# Patient Record
Sex: Female | Born: 1980
Health system: Southern US, Community
[De-identification: ages and names within clinical notes are randomized; demographics above are authoritative.]

## PROBLEM LIST (undated history)

## (undated) DIAGNOSIS — I10 Essential (primary) hypertension: Secondary | ICD-10-CM

## (undated) DIAGNOSIS — N6009 Solitary cyst of unspecified breast: Secondary | ICD-10-CM

## (undated) DIAGNOSIS — R87629 Unspecified abnormal cytological findings in specimens from vagina: Secondary | ICD-10-CM

## (undated) DIAGNOSIS — O24419 Gestational diabetes mellitus in pregnancy, unspecified control: Secondary | ICD-10-CM

## (undated) DIAGNOSIS — D069 Carcinoma in situ of cervix, unspecified: Secondary | ICD-10-CM

## (undated) DIAGNOSIS — Z973 Presence of spectacles and contact lenses: Secondary | ICD-10-CM

## (undated) DIAGNOSIS — E669 Obesity, unspecified: Secondary | ICD-10-CM

## (undated) DIAGNOSIS — E8881 Metabolic syndrome: Secondary | ICD-10-CM

## (undated) HISTORY — DX: Unspecified abnormal cytological findings in specimens from vagina: R87.629

## (undated) HISTORY — PX: OTHER SURGICAL HISTORY: SHX169

## (undated) HISTORY — DX: Solitary cyst of unspecified breast: N60.09

## (undated) HISTORY — DX: Metabolic syndrome: E88.81

## (undated) HISTORY — PX: UPPER GI ENDOSCOPY: SHX6162

## (undated) HISTORY — DX: Carcinoma in situ of cervix, unspecified: D06.9

## (undated) HISTORY — DX: Essential (primary) hypertension: I10

## (undated) HISTORY — DX: Obesity, unspecified: E66.9

## (undated) HISTORY — PX: TONSILLECTOMY: SUR1361

## (undated) HISTORY — DX: Gestational diabetes mellitus in pregnancy, unspecified control: O24.419

---

## 2010-04-15 DIAGNOSIS — D069 Carcinoma in situ of cervix, unspecified: Secondary | ICD-10-CM

## 2010-04-15 HISTORY — DX: Carcinoma in situ of cervix, unspecified: D06.9

## 2010-06-14 HISTORY — PX: CERVICAL CONE BIOPSY: SUR198

## 2014-04-25 LAB — HM PAP SMEAR

## 2014-09-07 LAB — LIPID PANEL
Cholesterol: 223 mg/dL — AB (ref 0–200)
HDL: 37 mg/dL (ref 35–70)
LDL Cholesterol: 142 mg/dL
Triglycerides: 218 mg/dL — AB (ref 40–160)

## 2014-09-07 LAB — BASIC METABOLIC PANEL
BUN: 6 mg/dL (ref 4–21)
Creatinine: 0.7 mg/dL (ref 0.5–1.1)
Glucose: 85 mg/dL
Sodium: 138 mmol/L (ref 137–147)

## 2014-09-07 LAB — HEPATIC FUNCTION PANEL
ALT: 22 U/L (ref 7–35)
AST: 19 U/L (ref 13–35)
Alkaline Phosphatase: 69 U/L (ref 25–125)

## 2014-09-07 LAB — CBC AND DIFFERENTIAL: Platelets: 468 10*3/uL — AB (ref 150–399)

## 2015-03-13 LAB — BASIC METABOLIC PANEL
BUN: 10 mg/dL (ref 4–21)
Creatinine: 0.7 mg/dL (ref 0.5–1.1)
Glucose: 93 mg/dL
Potassium: 4.7 mmol/L (ref 3.4–5.3)
Sodium: 136 mmol/L — AB (ref 137–147)

## 2015-03-13 LAB — LIPID PANEL
Cholesterol: 222 mg/dL — AB (ref 0–200)
HDL: 44 mg/dL (ref 35–70)
LDL Cholesterol: 157 mg/dL
Triglycerides: 106 mg/dL (ref 40–160)

## 2015-03-13 LAB — HEPATIC FUNCTION PANEL
ALT: 21 U/L (ref 7–35)
AST: 16 U/L (ref 13–35)
Alkaline Phosphatase: 73 U/L (ref 25–125)

## 2015-03-13 LAB — HEMOGLOBIN A1C: Hemoglobin A1C: 6

## 2015-03-13 LAB — CBC AND DIFFERENTIAL
Hemoglobin: 15.4 g/dL (ref 12.0–16.0)
Platelets: 479 10*3/uL — AB (ref 150–399)

## 2015-03-14 LAB — CBC AND DIFFERENTIAL
HCT: 46 % (ref 36–46)
Platelets: 479 10*3/uL — AB (ref 150–399)
WBC: 10.4 10^3/mL

## 2015-03-14 LAB — VITAMIN D 25 HYDROXY (VIT D DEFICIENCY, FRACTURES): Vit D, 25-Hydroxy: 35.4

## 2015-11-16 LAB — HM PAP SMEAR: HM Pap smear: NEGATIVE

## 2015-11-22 ENCOUNTER — Encounter: Payer: Self-pay | Admitting: Physician Assistant

## 2015-11-22 ENCOUNTER — Ambulatory Visit (INDEPENDENT_AMBULATORY_CARE_PROVIDER_SITE_OTHER): Payer: Managed Care, Other (non HMO) | Admitting: Physician Assistant

## 2015-11-22 VITALS — BP 128/72 | HR 91 | Ht 63.0 in | Wt 194.0 lb

## 2015-11-22 DIAGNOSIS — D069 Carcinoma in situ of cervix, unspecified: Secondary | ICD-10-CM

## 2015-11-22 DIAGNOSIS — E669 Obesity, unspecified: Secondary | ICD-10-CM | POA: Diagnosis not present

## 2015-11-22 DIAGNOSIS — E8881 Metabolic syndrome: Secondary | ICD-10-CM

## 2015-11-22 DIAGNOSIS — I10 Essential (primary) hypertension: Secondary | ICD-10-CM | POA: Diagnosis not present

## 2015-11-22 DIAGNOSIS — N6009 Solitary cyst of unspecified breast: Secondary | ICD-10-CM | POA: Insufficient documentation

## 2015-11-22 DIAGNOSIS — E88819 Insulin resistance, unspecified: Secondary | ICD-10-CM

## 2015-11-22 HISTORY — DX: Solitary cyst of unspecified breast: N60.09

## 2015-11-22 HISTORY — DX: Essential (primary) hypertension: I10

## 2015-11-22 HISTORY — DX: Obesity, unspecified: E66.9

## 2015-11-22 HISTORY — DX: Metabolic syndrome: E88.81

## 2015-11-22 HISTORY — DX: Carcinoma in situ of cervix, unspecified: D06.9

## 2015-11-22 HISTORY — DX: Insulin resistance, unspecified: E88.819

## 2015-11-22 MED ORDER — LISINOPRIL-HYDROCHLOROTHIAZIDE 20-25 MG PO TABS: 1.0000 | ORAL_TABLET | Freq: Every day | ORAL | 11 refills | Status: DC

## 2015-11-22 NOTE — Patient Instructions (Signed)
Insulin Resistance  Insulin is a hormone made by the pancreas that controls blood sugar (glucose) levels. It is needed for your body to function normally. Insulin resistance occurs when your body does not respond well to the insulin made by your pancreas. Insulin resistance results in high blood glucose levels and can lead to problems, including:  · Prediabetes.  · Type 2 diabetes.  · Heart disease.  · High blood pressure (hypertension).  · Stroke.  · Polycystic ovary syndrome.  · Fatty liver.  CAUSES   The exact causes is unknown.  RISK FACTORS  · Being overweight or obese, especially if your weight is centered in the waist area.  · Physical inactivity.  SIGNS AND SYMPTOMS   There are usually no symptoms.  DIAGNOSIS   There is no test to diagnose insulin resistance. However, your health care provider may suspect that you have insulin resistance if you have:  · High blood glucose levels.  · Abnormal cholesterol levels.  · A waist circumference of more than 35 inches for women and more than 40 inches for men.  · The risk factors for insulin resistance.  To make a diagnosis, your health care provider will perform a physical exam. During the exam, he or she will ask you questions about your health and medical history.  TREATMENT   The most important treatment is lifestyle changes. Your health care provider can help you make the changes that are appropriate. These may include:  · Eating less.  · Being physically active.  · Stopping the use of any tobacco products.  · Taking medicine to help improve your insulin sensitivity.  HOME CARE INSTRUCTIONS   · Eat a healthy diet.    Choose healthy portion sizes.    Eat more vegetables.    Drink more water.    Cut back on high-fat toppings.    Eat foods that are low in fat, such as fruits, vegetables, and lean meats.  · Be physically active to reach and maintain a healthy weight.    Exercise 4 days a week for 20-40 minutes at a time, or as directed by your health care provider.     Take the stairs instead of the elevator.    Walk around when you talk on the phone.    Walk your dog if you have one.  · Take medicines only as directed by your health care provider.  · Do not use any tobacco products, including cigarettes, chewing tobacco, or electronic cigarettes. If you need help quitting, ask your health care provider.  · Keep all follow-up visits as directed by your health care provider. This is important.  SEEK MEDICAL CARE IF:   · You have trouble losing weight or maintaining your goal weight.  · You gain weight.  · You have trouble following your prescribed meal plan.  · You cannot exercise or do your normal exercises.     This information is not intended to replace advice given to you by your health care provider. Make sure you discuss any questions you have with your health care provider.     Document Released: 05/21/2005 Document Revised: 04/22/2014 Document Reviewed: 09/10/2012  Elsevier Interactive Patient Education ©2016 Elsevier Inc.

## 2015-11-22 NOTE — Progress Notes (Signed)
   Subjective:    Patient ID: Rachael Dickerson, female    DOB: 10/01/1980, 35 y.o.   MRN: [id number]  HPI Pt is a 35 yo female who presents to the clinic to establish care.   .. Active Ambulatory Problems    Diagnosis Date Noted  . Adenocarcinoma of cervix, stage 1 (HCC) 11/22/2015  . Benign cyst of breast 11/22/2015  . Essential hypertension, benign 11/22/2015  . Insulin resistance 11/22/2015   Resolved Ambulatory Problems    Diagnosis Date Noted  . No Resolved Ambulatory Problems   No Additional Past Medical History   .Marland Kitchen. Family History  Problem Relation Age of Onset  . Hypertension Mother   . Cancer Maternal Grandmother     melanoma   .Marland Kitchen. Social History   Social History  . Marital status: N/A    Spouse name: N/A  . Number of children: N/A  . Years of education: N/A   Occupational History  . Not on file.   Social History Main Topics  . Smoking status: Former Games developermoker  . Smokeless tobacco: Never Used  . Alcohol use Yes  . Drug use: No  . Sexual activity: Yes   Other Topics Concern  . Not on file   Social History Narrative  . No narrative on file   Pt has HTN on zestoretic. Doing well with no concerns or complaints. No CP, palpitations, headaches and dizziness.    Review of Systems  All other systems reviewed and are negative.      Objective:   Physical Exam  Constitutional: She is oriented to person, place, and time. She appears well-developed and well-nourished.  HENT:  Head: Normocephalic and atraumatic.  Cardiovascular: Normal rate, regular rhythm and normal heart sounds.   Pulmonary/Chest: Effort normal and breath sounds normal.  Neurological: She is alert and oriented to person, place, and time.  Psychiatric: She has a normal mood and affect. Her behavior is normal.          Assessment & Plan:  HTN- controlled on zestoretic. Refilled for one year.  Per pt had labs drawn in [place] before she left. Will send for records.   Insulin  resistance- gave HO. Discussed metformin. Pt will continue to think on medication. Metformin could help with weight loss. Discussed diet to help with insulin resistance.   Obesity- BMI 34. Discussed diet and exercise. Consider metformin.   Adenocarcinoma of cervix, remission- followed by oncology. Last pap normal cells in 11/2015. Dr. Clifton JamesSkinner, [facility] [place].   Pt left before Tdap was given. Will get at next visit.

## 2015-12-08 ENCOUNTER — Encounter: Payer: Self-pay | Admitting: *Deleted

## 2015-12-08 LAB — VITAMIN B12: VITAMIN B12: 193

## 2015-12-08 LAB — VITAMIN D 25 HYDROXY (VIT D DEFICIENCY, FRACTURES)
Vit D, 25-Hydroxy: 35.4
Vit D, 25-Hydroxy: 34.3

## 2015-12-12 ENCOUNTER — Encounter: Payer: Self-pay | Admitting: Physician Assistant

## 2015-12-15 ENCOUNTER — Encounter: Payer: Self-pay | Admitting: Physician Assistant

## 2015-12-27 LAB — HEPATIC FUNCTION PANEL
ALT: 23 U/L (ref 7–35)
AST: 13 U/L (ref 13–35)
Alkaline Phosphatase: 58 U/L (ref 25–125)
Bilirubin, Total: 0.3 mg/dL

## 2015-12-27 LAB — BASIC METABOLIC PANEL
BUN: 9 mg/dL (ref 4–21)
Creatinine: 0.6 mg/dL (ref 0.5–1.1)
Glucose: 92 mg/dL
Potassium: 4.4 mmol/L (ref 3.4–5.3)
Sodium: 137 mmol/L (ref 137–147)

## 2015-12-27 LAB — CBC AND DIFFERENTIAL
HCT: 43 % (ref 36–46)
Hemoglobin: 14.6 g/dL (ref 12.0–16.0)
Neutrophils Absolute: 5 /uL
Platelets: 462 10*3/uL — AB (ref 150–399)
WBC: 8.4 10^3/mL

## 2015-12-27 LAB — LIPID PANEL
Cholesterol: 183 mg/dL (ref 0–200)
HDL: 36 mg/dL (ref 35–70)
LDL Cholesterol: 110 mg/dL
Triglycerides: 185 mg/dL — AB (ref 40–160)

## 2016-01-04 ENCOUNTER — Encounter: Payer: Self-pay | Admitting: Physician Assistant

## 2016-03-12 ENCOUNTER — Encounter: Payer: Self-pay | Admitting: Physician Assistant

## 2016-03-12 ENCOUNTER — Ambulatory Visit (INDEPENDENT_AMBULATORY_CARE_PROVIDER_SITE_OTHER): Payer: BLUE CROSS/BLUE SHIELD | Admitting: Physician Assistant

## 2016-03-12 VITALS — BP 144/78 | HR 94 | Ht 63.0 in | Wt 201.0 lb

## 2016-03-12 DIAGNOSIS — D069 Carcinoma in situ of cervix, unspecified: Secondary | ICD-10-CM | POA: Diagnosis not present

## 2016-03-12 DIAGNOSIS — R238 Other skin changes: Secondary | ICD-10-CM

## 2016-03-12 DIAGNOSIS — T753XXA Motion sickness, initial encounter: Secondary | ICD-10-CM

## 2016-03-12 MED ORDER — METRONIDAZOLE 1 % EX GEL: Freq: Every day | CUTANEOUS | 0 refills | Status: DC

## 2016-03-12 MED ORDER — SCOPOLAMINE 1 MG/3DAYS TD PT72: 1.0000 | MEDICATED_PATCH | TRANSDERMAL | 1 refills | Status: DC

## 2016-03-12 NOTE — Progress Notes (Signed)
   Subjective:    Patient ID: Rachael LoaJennifer Dickerson, female    DOB: [birth year], 35 y.o.   MRN: [id number]  HPI  Pt is a 35 yo female who presents to the clinic for a few concerns today.   She has a papule that has formed on left upper cheek in the last week. It does not hurt but itches some. She has not tried anything to make better.   She is going on a cruise and needs anti-nausea medication.   She has hx of adenocarcinoma of cervix she needs another referral to oncology/gyn for follow up. She went to office in [place]    Review of Systems  All other systems reviewed and are negative.      Objective:   Physical Exam  Constitutional: She is oriented to person, place, and time. She appears well-developed and well-nourished.  HENT:  Head: Normocephalic and atraumatic.  Cardiovascular: Normal rate, regular rhythm and normal heart sounds.   Pulmonary/Chest: Effort normal and breath sounds normal.  Neurological: She is alert and oriented to person, place, and time.  Skin:     Psychiatric: She has a normal mood and affect. Her behavior is normal.          Assessment & Plan:  Marland Kitchen.Marland Kitchen.Diagnoses and all orders for this visit:  Papule of skin -     metroNIDAZOLE (METROGEL) 1 % gel; Apply topically daily. -     Ambulatory referral to Dermatology  Motion sickness, initial encounter -     scopolamine (TRANSDERM-SCOP) 1 MG/3DAYS; Place 1 patch (1.5 mg total) onto the skin every 3 (three) days.  Adenocarcinoma in situ of cervix -     Ambulatory referral to Gynecologic Oncology  Other orders -     Discontinue: metroNIDAZOLE (METROGEL) 1 % gel; Apply topically daily.   Unclear etiology of skin papule. Reassured patient it did not appear to be infectious of skin cancer. Suspicious for non pigmented nevus but popping up so quickly is not common. Also wondering if could be from rosacea. metrogel given and discussed if itchy could combine with hydrocortisone. Referral made. Either way patient wants  removed if it can be.

## 2016-03-13 ENCOUNTER — Encounter: Payer: Self-pay | Admitting: Physician Assistant

## 2016-04-04 ENCOUNTER — Telehealth: Payer: Self-pay | Admitting: Hematology & Oncology

## 2016-04-04 NOTE — Telephone Encounter (Signed)
Called patient regarding New Patient referral received from Tandy [facility]Jade Breeback at [facility]. Patient stated that she does not want to see Dr. Myna HidalgoEnnever if she will be billed under the Hospital status, as opposed to Physician's Office. I advised patient that she would be billed under both our Hospital and Physician Billing categories if seen in our office. Patient stated that she wants to call [facility] Billing and inquire before scheduling a New Patient appt with Dr. Myna HidalgoEnnever. I provided the patient the Billing Dept number of [phone]. Patient stated she would call them first.       [facility].

## 2016-04-09 ENCOUNTER — Encounter: Payer: Self-pay | Admitting: Physician Assistant

## 2016-05-15 ENCOUNTER — Telehealth: Payer: Self-pay | Admitting: Hematology & Oncology

## 2016-05-15 NOTE — Telephone Encounter (Signed)
Patient called and stated that she does not want a New Patient Appt with Dr. Myna HidalgoEnnever because her insurance will charge her an additional $1,000.00 due to the hospital facility charge. Patient stated that she will call Dallas SchimkeJade Breeback's office to inquire if they can give a referral to an hematolgist/oncologist not associated with a larger health system.       [facility] JANUARY 2018  [facility]

## 2016-07-17 ENCOUNTER — Encounter: Payer: Self-pay | Admitting: Physician Assistant

## 2016-07-17 ENCOUNTER — Ambulatory Visit (INDEPENDENT_AMBULATORY_CARE_PROVIDER_SITE_OTHER): Payer: BLUE CROSS/BLUE SHIELD | Admitting: Physician Assistant

## 2016-07-17 VITALS — BP 155/87 | HR 102 | Ht 63.0 in | Wt 205.0 lb

## 2016-07-17 DIAGNOSIS — I10 Essential (primary) hypertension: Secondary | ICD-10-CM

## 2016-07-17 DIAGNOSIS — Z319 Encounter for procreative management, unspecified: Secondary | ICD-10-CM | POA: Diagnosis not present

## 2016-07-17 DIAGNOSIS — D069 Carcinoma in situ of cervix, unspecified: Secondary | ICD-10-CM

## 2016-07-17 MED ORDER — METHYLDOPA 250 MG PO TABS: 250.0000 mg | ORAL_TABLET | Freq: Two times a day (BID) | ORAL | 0 refills | Status: DC

## 2016-07-17 NOTE — Patient Instructions (Signed)
Methyldopa tablets What is this medicine? METHYLDOPA (meth ill DOE pa) is used to treat high blood pressure. This medicine may be used for other purposes; ask your health care provider or pharmacist if you have questions. COMMON BRAND NAME(S): Aldomet What should I tell my health care provider before I take this medicine? They need to know if you have any of these conditions: -anemia -kidney or liver disease -an unusual or allergic reaction to methyldopa, other medicines, foods, dyes, or preservatives -pregnant or trying to get pregnant -breast-feeding How should I use this medicine? Take this medicine by mouth with a glass of water. Follow the directions on the prescription label. Take your doses at regular intervals. Do not take your medicine more often than directed. Do not stop taking except on the advice of your doctor or health care professional. Talk to your pediatrician regarding the use of this medicine in children. Special care may be needed. Overdosage: If you think you have taken too much of this medicine contact a poison control center or emergency room at once. NOTE: This medicine is only for you. Do not share this medicine with others. What if I miss a dose? If you miss a dose, take it as soon as you can. If it is almost time for your next dose, take only that dose. Do not take double or extra doses. What may interact with this medicine? Do not take this medicine with any of the following medications: -MAOIs like Carbex, Eldepryl, Marplan, Nardil, and Parnate This medicine also may interact with the following medications: -iron salts -lithium -medicines for high blood pressure This list may not describe all possible interactions. Give your health care provider a list of all the medicines, herbs, non-prescription drugs, or dietary supplements you use. Also tell them if you smoke, drink alcohol, or use illegal drugs. Some items may interact with your medicine. What should I  watch for while using this medicine? Visit your doctor or health care professional for regular checks on your progress. Check your heart rate and blood pressure regularly. Ask your doctor or health care professional what your heart rate should be and when you should contact him or her. If you get a fever, especially in the first few months, call your doctor or health care professional. Do not treat yourself. You may get drowsy or dizzy. Do not drive, use machinery, or do anything that needs mental alertness until you know how this medicine affects you. To avoid dizzy or fainting spells, do not stand or sit up quickly, especially if you are an older person. Alcohol can make you more drowsy and dizzy. Avoid alcoholic drinks. Your mouth may get dry. Chewing sugarless gum or sucking hard candy, and drinking plenty of water may help. Contact your doctor if the problem does not go away or is severe. Iron can stop the absorption of this medicine. Do not take this medicine with iron preparations or multiple vitamins containing iron. If you have to take iron, make sure that there are at least 2 hours between iron and methyldopa doses. What side effects may I notice from receiving this medicine? Side effects that you should report to your doctor or health care professional as soon as possible: -allergic reactions like skin rash, itching or hives, swelling of the face, lips, or tongue -black, sore tongue -chest pain -dark yellow or brown urine -depression -difficulty sleeping, nightmares -fever (usually within the first 3 months of treatment) -slow heartbeat -stomach pain -swelling of the feet or  legs -unusually weak or tired -yellowing of the eyes or skin Side effects that usually do not require medical attention (report to your doctor or health care professional if they continue or are bothersome): -abnormal production of milk in females -breast enlargement in both males and females -change in sex drive  or performance -diarrhea -headache -nausea, vomiting -numbness or tingling in hands or feet This list may not describe all possible side effects. Call your doctor for medical advice about side effects. You may report side effects to FDA at 1-800-FDA-1088. Where should I keep my medicine? Keep out of the reach of children. Store at room temperature between 15 and 30 degrees C (59 and 86 degrees F). Keep container tightly closed. Throw away any unused medicine after the expiration date. NOTE: This sheet is a summary. It may not cover all possible information. If you have questions about this medicine, talk to your doctor, pharmacist, or health care provider.  2018 Elsevier/Gold Standard (2007-10-19 14:04:24)

## 2016-07-17 NOTE — Progress Notes (Signed)
   Subjective:    Patient ID: Rachael Dickerson, female    DOB: 01-Jul-1980, 36 y.o.   MRN: [id number]  HPI  Pt is a 36 yo female who presents to the clinic for follow up on HTN. She is taking medication but she desires to get pregnant. She has been checking BP at home and reports better readings under 140/90. She denies any CP, palpitations, headaches, SOB. This will be her first pregnancy. She does have hx of adenocarcinoma of cervix in remission. She remains on OCP.    Review of Systems  All other systems reviewed and are negative.      Objective:   Physical Exam  Constitutional: She is oriented to person, place, and time. She appears well-developed and well-nourished.  HENT:  Head: Normocephalic and atraumatic.  Cardiovascular: Normal rate, regular rhythm and normal heart sounds.   Pulmonary/Chest: Effort normal and breath sounds normal. She has no wheezes.  Neurological: She is alert and oriented to person, place, and time.  Psychiatric: She has a normal mood and affect. Her behavior is normal.          Assessment & Plan:  Marland KitchenMarland KitchenDiagnoses and all orders for this visit:  Patient desires pregnancy  Essential hypertension, benign -     methyldopa (ALDOMET) 250 MG tablet; Take 1 tablet (250 mg total) by mouth 2 (two) times daily.  Adenocarcinoma in situ of cervix   Pt instructed to StOP lisinopril.  Start methyldopa bid.  2 week nurse visit.  Once BP controlled stop OCP.  Wait 1-2 cycles and can start trying to get pregnant.  Start prenatal vitamin now.

## 2016-07-31 ENCOUNTER — Ambulatory Visit (INDEPENDENT_AMBULATORY_CARE_PROVIDER_SITE_OTHER): Payer: BLUE CROSS/BLUE SHIELD | Admitting: Family Medicine

## 2016-07-31 VITALS — BP 134/80 | HR 78 | Resp 16 | Wt 202.2 lb

## 2016-07-31 DIAGNOSIS — I10 Essential (primary) hypertension: Secondary | ICD-10-CM

## 2016-07-31 NOTE — Progress Notes (Signed)
Pt presents to clinic for BP check after recent change in medications. No complaints of dizziness, headaches, chest pain/discomfort. Pt also states at home readings have been around 120/70.

## 2016-08-16 ENCOUNTER — Other Ambulatory Visit: Payer: Self-pay | Admitting: Physician Assistant

## 2016-08-16 DIAGNOSIS — I10 Essential (primary) hypertension: Secondary | ICD-10-CM

## 2016-09-19 ENCOUNTER — Other Ambulatory Visit: Payer: Self-pay | Admitting: Physician Assistant

## 2016-09-19 DIAGNOSIS — I10 Essential (primary) hypertension: Secondary | ICD-10-CM

## 2016-10-04 ENCOUNTER — Ambulatory Visit (INDEPENDENT_AMBULATORY_CARE_PROVIDER_SITE_OTHER): Payer: BLUE CROSS/BLUE SHIELD | Admitting: Physician Assistant

## 2016-10-04 VITALS — BP 134/88 | HR 77 | Temp 97.7°F | Wt 193.0 lb

## 2016-10-04 DIAGNOSIS — N3000 Acute cystitis without hematuria: Secondary | ICD-10-CM

## 2016-10-04 LAB — POCT URINALYSIS DIPSTICK
Bilirubin, UA: NEGATIVE
Glucose, UA: NEGATIVE
Ketones, UA: NEGATIVE
Nitrite, UA: NEGATIVE
Protein, UA: NEGATIVE
Spec Grav, UA: <=1.005 — AB (ref 1.010–1.025)
Urobilinogen, UA: 0.2 E.U./dL
pH, UA: 7 (ref 5.0–8.0)

## 2016-10-04 LAB — POCT URINE PREGNANCY: Preg Test, Ur: NEGATIVE

## 2016-10-04 MED ORDER — NITROFURANTOIN MONOHYD MACRO 100 MG PO CAPS: 100.0000 mg | ORAL_CAPSULE | Freq: Two times a day (BID) | ORAL | 0 refills | Status: DC

## 2016-10-04 NOTE — Progress Notes (Signed)
HPI:                                                                Rachael LoaJennifer Dickerson is a 36 y.o. female who [facility] today for dysuria  Dysuria   This is a new problem. The current episode started today. The problem occurs every urination. The problem has been unchanged. The quality of the pain is described as burning. The pain is mild. There has been no fever. She is sexually active. There is no history of pyelonephritis. Associated symptoms include frequency. Pertinent negatives include no chills, discharge, flank pain, nausea, possible pregnancy, sweats or vomiting.    Past Medical History:  Diagnosis Date  . Adenocarcinoma in situ of cervix 11/22/2015   2012. Last pap was 11/16/2015 and cells were normal.   . Benign cyst of breast 11/22/2015  . Essential hypertension, benign 11/22/2015  . Insulin resistance 11/22/2015  . Obesity 11/22/2015   Past Surgical History:  Procedure Laterality Date  . TONSILLECTOMY     Social History  Substance Use Topics  . Smoking status: Former Games developermoker  . Smokeless tobacco: Never Used  . Alcohol use Yes   family history includes Cancer in her maternal grandmother; Hypertension in her mother.  ROS: negative except as noted in the HPI  Medications: Current Outpatient Prescriptions  Medication Sig Dispense Refill  . methyldopa (ALDOMET) 250 MG tablet TAKE 1 TABLET BY MOUTH TWICE DAILY 60 tablet 0  . nitrofurantoin, macrocrystal-monohydrate, (MACROBID) 100 MG capsule Take 1 capsule (100 mg total) by mouth 2 (two) times daily. 14 capsule 0   No current facility-administered medications for this visit.    Allergies  Allergen Reactions  . Erythromycin   . Latex        Objective:  BP 134/88   Pulse 77   Temp 97.7 F (36.5 C) (Oral)   Wt 193 lb (87.5 kg)   LMP 09/26/2016 (Exact Date)   BMI 34.19 kg/m  Gen: well-groomed, cooperative, not ill-appearing, no distress Pulm: Normal work of  breathing, normal phonation, clear to auscultation bilaterally CV: Normal rate, regular rhythm, s1 and s2 distinct, no murmurs, clicks or rubs  GI: abdomen soft, nondistended, nontender, no CVA tenderness Neuro: alert and oriented x 3, EOM's intact, no tremor MSK: moving all extremities, normal gait and station, no peripheral edema Skin: warm, dry, intact; no rashes or lesions on exposed skin, no cyanosis   Results for orders placed or performed in visit on 10/04/16 (from the past 72 hour(s))  POCT Urinalysis Dipstick     Status: Abnormal   Collection Time: 10/04/16  1:53 PM  Result Value Ref Range   Color, UA light yellow    Clarity, UA clear    Glucose, UA negative    Bilirubin, UA negative    Ketones, UA negative    Spec Grav, UA <=1.005 (A) 1.010 - 1.025   Blood, UA trace    pH, UA 7.0 5.0 - 8.0   Protein, UA negative    Urobilinogen, UA 0.2 0.2 or 1.0 E.U./dL   Nitrite, UA negative    Leukocytes, UA Small (1+) (A) Negative  POCT urine pregnancy     Status: Normal   Collection Time: 10/04/16  1:57  PM  Result Value Ref Range   Preg Test, Ur Negative Negative   No results found.    Assessment and Plan: 36 y.o. female with   1. Acute cystitis without hematuria - POCT Urinalysis Dipstick positive for trace blood, small leuks - POCT urine pregnancy negative - Urine Culture; Future - nitrofurantoin, macrocrystal-monohydrate, (MACROBID) 100 MG capsule; Take 1 capsule (100 mg total) by mouth 2 (two) times daily.  Dispense: 14 capsule; Refill: 0  Patient education and anticipatory guidance given Patient agrees with treatment plan Follow-up as needed if symptoms worsen or fail to improve  Levonne Hubert PA-C

## 2016-10-04 NOTE — Patient Instructions (Signed)
- Take Macrobid twice a day for 7 days - Azo over-the-counter as needed for painful urination - Void after sexual intercourse to help prevent future UTI's   Urinary Tract Infection, Adult A urinary tract infection (UTI) is an infection of any part of the urinary tract, which includes the kidneys, ureters, bladder, and urethra. These organs make, store, and get rid of urine in the body. UTI can be a bladder infection (cystitis) or kidney infection (pyelonephritis). What are the causes? This infection may be caused by fungi, viruses, or bacteria. Bacteria are the most common cause of UTIs. This condition can also be caused by repeated incomplete emptying of the bladder during urination. What increases the risk? This condition is more likely to develop if:  You ignore your need to urinate or hold urine for long periods of time.  You do not empty your bladder completely during urination.  You wipe back to front after urinating or having a bowel movement, if you are female.  You are uncircumcised, if you are female.  You are constipated.  You have a urinary catheter that stays in place (indwelling).  You have a weak defense (immune) system.  You have a medical condition that affects your bowels, kidneys, or bladder.  You have diabetes.  You take antibiotic medicines frequently or for long periods of time, and the antibiotics no longer work well against certain types of infections (antibiotic resistance).  You take medicines that irritate your urinary tract.  You are exposed to chemicals that irritate your urinary tract.  You are female.  What are the signs or symptoms? Symptoms of this condition include:  Fever.  Frequent urination or passing small amounts of urine frequently.  Needing to urinate urgently.  Pain or burning with urination.  Urine that smells bad or unusual.  Cloudy urine.  Pain in the lower abdomen or back.  Trouble urinating.  Blood in the  urine.  Vomiting or being less hungry than normal.  Diarrhea or abdominal pain.  Vaginal discharge, if you are female.  How is this diagnosed? This condition is diagnosed with a medical history and physical exam. You will also need to provide a urine sample to test your urine. Other tests may be done, including:  Blood tests.  Sexually transmitted disease (STD) testing.  If you have had more than one UTI, a cystoscopy or imaging studies may be done to determine the cause of the infections. How is this treated? Treatment for this condition often includes a combination of two or more of the following:  Antibiotic medicine.  Other medicines to treat less common causes of UTI.  Over-the-counter medicines to treat pain.  Drinking enough water to stay hydrated.  Follow these instructions at home:  Take over-the-counter and prescription medicines only as told by your health care provider.  If you were prescribed an antibiotic, take it as told by your health care provider. Do not stop taking the antibiotic even if you start to feel better.  Avoid alcohol, caffeine, tea, and carbonated beverages. They can irritate your bladder.  Drink enough fluid to keep your urine clear or pale yellow.  Keep all follow-up visits as told by your health care provider. This is important.  Make sure to: ? Empty your bladder often and completely. Do not hold urine for long periods of time. ? Empty your bladder before and after sex. ? Wipe from front to back after a bowel movement if you are female. Use each tissue one time when  you wipe. Contact a health care provider if:  You have back pain.  You have a fever.  You feel nauseous or vomit.  Your symptoms do not get better after 3 days.  Your symptoms go away and then return. Get help right away if:  You have severe back pain or lower abdominal pain.  You are vomiting and cannot keep down any medicines or water. This information is not  intended to replace advice given to you by your health care provider. Make sure you discuss any questions you have with your health care provider. Document Released: 01/09/2005 Document Revised: 09/13/2015 Document Reviewed: 02/20/2015 Elsevier Interactive Patient Education  2017 [employer]

## 2016-10-06 ENCOUNTER — Encounter: Payer: Self-pay | Admitting: Physician Assistant

## 2016-10-10 LAB — URINE CULTURE

## 2016-10-21 ENCOUNTER — Telehealth: Payer: Self-pay | Admitting: *Deleted

## 2016-10-21 DIAGNOSIS — N3000 Acute cystitis without hematuria: Secondary | ICD-10-CM

## 2016-10-21 MED ORDER — CEFUROXIME AXETIL 250 MG PO TABS: 250.0000 mg | ORAL_TABLET | Freq: Two times a day (BID) | ORAL | 0 refills | Status: AC

## 2016-10-21 NOTE — Telephone Encounter (Signed)
Please let patient know we don't typically call with urine culture results unless there is a need to change or discontinue the antibiotic. In her case, her urine culture was positive and the antibiotic prescribed should cover it.  She can come in for a nurse visit for a urinalysis and urine culture. I will send a new antibiotic to her pharmacy once she provides her urine sample. If antibiotic is started before the urine is cultured it can lead to a false negative.

## 2016-10-21 NOTE — Telephone Encounter (Signed)
That is fine. Prescription sent for a new antibiotic.

## 2016-10-21 NOTE — Telephone Encounter (Signed)
Patient notified. The patient has a high deductible and already she says she owes about $145. She states that it is getting very expensive to come in. She states her symptoms never completely went away even after having started taking the antibiotic.The patient states she took the medication just as it instructed. She states she called the other day and [facility] but this would have been even more expensive for her.I did explain to her the process of rechecking urine since she is still having symtoms and that the bacteria sensitivities were checked on culture and bacteria should have been sensitive to antibiotic prescribed. The patient wants to know if possible  can she have another prescription for an  antibiotic before coming in at this point due to financial hardship. Please advise

## 2016-10-21 NOTE — Telephone Encounter (Signed)
Information discussed with pt. Pt verbalized understanding. 

## 2016-10-21 NOTE — Telephone Encounter (Signed)
Can you please take a look at this patient's urine culture results? She is inquiring about the results and she recently started having symptoms again. Will need another appointment but no one ever called her about her culture results

## 2016-10-22 ENCOUNTER — Other Ambulatory Visit: Payer: Self-pay | Admitting: Physician Assistant

## 2016-10-22 ENCOUNTER — Telehealth: Payer: Self-pay | Admitting: *Deleted

## 2016-10-22 DIAGNOSIS — I10 Essential (primary) hypertension: Secondary | ICD-10-CM

## 2016-10-22 NOTE — Telephone Encounter (Signed)
Jade prescribed methyldopa for patient and this has been placed on back order until December. I called the pharmacy to confirm and the manufacturer has placed this on back order. The pharmacy was able to give the patient only 48 tablets today.

## 2016-10-22 NOTE — Telephone Encounter (Signed)
Noted, can address with PCP when she is back in the office or come sooner to discuss alternatives if needed

## 2016-10-23 NOTE — Telephone Encounter (Signed)
Message left on patients vm 

## 2016-11-06 ENCOUNTER — Ambulatory Visit (INDEPENDENT_AMBULATORY_CARE_PROVIDER_SITE_OTHER): Payer: BLUE CROSS/BLUE SHIELD | Admitting: Physician Assistant

## 2016-11-06 ENCOUNTER — Encounter: Payer: Self-pay | Admitting: Physician Assistant

## 2016-11-06 VITALS — BP 122/81 | HR 96 | Wt 195.0 lb

## 2016-11-06 DIAGNOSIS — I10 Essential (primary) hypertension: Secondary | ICD-10-CM

## 2016-11-06 MED ORDER — LABETALOL HCL 100 MG PO TABS: 100.0000 mg | ORAL_TABLET | Freq: Two times a day (BID) | ORAL | 0 refills | Status: DC

## 2016-11-06 NOTE — Progress Notes (Signed)
HPI:                                                                Rachael Dickerson is a 36 y.o. female who [facility] today for hypertension follow-up  Patient with PMH of HTN who desires pregnancy (G0P0) was recently placed on Methyldopa by her PCP. Reports that this medication is unavailable and is requesting an alternative.   Does not Check BP's at home. Denies vision change, headache, chest pain with exertion, orthopnea, lightheadedness, syncope and edema. Risk factors include: obesity, insulin resistance  Past Medical History:  Diagnosis Date  . Adenocarcinoma in situ of cervix 11/22/2015   2012. Last pap was 11/16/2015 and cells were normal.   . Benign cyst of breast 11/22/2015  . Essential hypertension, benign 11/22/2015  . Insulin resistance 11/22/2015  . Obesity 11/22/2015   Past Surgical History:  Procedure Laterality Date  . TONSILLECTOMY     Social History  Substance Use Topics  . Smoking status: Former Smoker    Packs/day: 1.00    Years: 20.00    Types: Cigarettes  . Smokeless tobacco: Never Used  . Alcohol use Yes   family history includes Cancer in her maternal grandmother; Hypertension in her mother.  ROS: negative except as noted in the HPI  Medications: Current Outpatient Prescriptions  Medication Sig Dispense Refill  . methyldopa (ALDOMET) 250 MG tablet TAKE 1 TABLET BY MOUTH TWICE DAILY 60 tablet 0   No current facility-administered medications for this visit.    Allergies  Allergen Reactions  . Erythromycin   . Latex        Objective:  BP 122/81   Pulse 96   Wt 195 lb (88.5 kg)   SpO2 99%   BMI 34.54 kg/m  Gen: obese, not ill-appearing, no distress HEENT: normal conjunctiva, trachea midline Pulm: Normal work of breathing, normal phonation, clear to auscultation bilaterally CV: Normal rate, regular rhythm, s1 and s2 distinct, no murmurs, clicks or rubs  Neuro: alert and oriented x 3,  EOM's intact, no tremor MSK: extremities atraumatic, normal gait and station Skin: no rashes on exposed skin, no cyanosis Psych: well-groomed, cooperative, good eye contact, normal affect, normal speech and thought content   No results found for this or any previous visit (from the past 72 hour(s)). No results found.   Assessment and Plan: 36 y.o. female with  1. Essential hypertension, benign - labetalol (NORMODYNE) 100 MG tablet; Take 1 tablet (100 mg total) by mouth 2 (two) times daily.  Dispense: 60 tablet; Refill: 0 - patient to monitor BP at home - she does not want to come in for a nurse BP check b/c she has a high-deductible plan - she will drop off her BP log - recommend referral to OB/GYN for pre-conception counseling given patient is high risk   Patient education and anticipatory guidance given Patient agrees with treatment plan Follow-up as needed if symptoms worsen or fail to improve  Levonne Hubertharley E. Eddie Payette PA-C

## 2016-11-06 NOTE — Patient Instructions (Signed)
Let's try Labetalol twice a day Monitor your BP's at home Follow-up in 2 weeks for a nurse visit BP check  If we are not able to control your pressure with Labetalol by itself, I recommend setting up a pre-conception counseling appointment with an OB/GYN or following up with Cardiology

## 2016-12-17 ENCOUNTER — Other Ambulatory Visit: Payer: Self-pay | Admitting: Physician Assistant

## 2016-12-17 DIAGNOSIS — I10 Essential (primary) hypertension: Secondary | ICD-10-CM

## 2016-12-17 NOTE — Telephone Encounter (Signed)
Ok great.

## 2017-01-16 ENCOUNTER — Other Ambulatory Visit: Payer: Self-pay | Admitting: Physician Assistant

## 2017-01-16 DIAGNOSIS — I10 Essential (primary) hypertension: Secondary | ICD-10-CM

## 2017-03-25 ENCOUNTER — Ambulatory Visit (INDEPENDENT_AMBULATORY_CARE_PROVIDER_SITE_OTHER): Payer: BLUE CROSS/BLUE SHIELD | Admitting: Physician Assistant

## 2017-03-25 ENCOUNTER — Encounter: Payer: Self-pay | Admitting: Physician Assistant

## 2017-03-25 VITALS — BP 136/78 | HR 103 | Ht 63.0 in | Wt 203.0 lb

## 2017-03-25 DIAGNOSIS — I10 Essential (primary) hypertension: Secondary | ICD-10-CM

## 2017-03-25 DIAGNOSIS — R5383 Other fatigue: Secondary | ICD-10-CM

## 2017-03-25 DIAGNOSIS — Z13220 Encounter for screening for lipoid disorders: Secondary | ICD-10-CM | POA: Diagnosis not present

## 2017-03-25 DIAGNOSIS — H5711 Ocular pain, right eye: Secondary | ICD-10-CM

## 2017-03-25 DIAGNOSIS — Z77011 Contact with and (suspected) exposure to lead: Secondary | ICD-10-CM | POA: Diagnosis not present

## 2017-03-25 MED ORDER — LABETALOL HCL 100 MG PO TABS: 100.0000 mg | ORAL_TABLET | Freq: Two times a day (BID) | ORAL | 3 refills | Status: DC

## 2017-03-25 NOTE — Progress Notes (Addendum)
Subjective:    Patient ID: Rachael Dickerson, female    DOB: 1980/10/02, 36 y.o.   MRN: [id number]  HPI Patient is a 36 y/o female who presents for right eye burning. She reports that right before Thanksgiving she slept in her contacts and when she woke up she had some pain and burning in her right eye and felt like there was an object in it. She describes the sensation of an object as occurring directly over the cornea when she has her contact lenses in or when she closes her eyes. She states that she removed her contacts for a few days and her symptoms resolved, however they reoccurred after she put her contacts back in. She thought her contact lenses may have been old so she switched to new contact lenses, however she still reports symptoms. She has tried rinsing her eye with saline with minimal releif She reports a history of ulcers in her right eye a few years ago due to a sensitivity to a new brand of contacts. She reports increased redness of her right eye when she has the contacts in and increased watering of her right eye. She denies itching, pain, dryness, or discharge of her right eye, as well as photophobia, blurred vision, or visual disturbances. She denies any symptoms in her left eye. She reports that she has an appointment with an eye doctor for her yearly vision check next week.  HTN - patient reports change in her antihypertensive medication to labetalol in July due to desired pregnancy. She reports taking her medication early in the morning and then late at night prior to going to bed and feels that it is not working as well with this timing. She reports elevation of her blood pressure around dinner time with this schedule.   Health maintenance - Patient reports working in a [employer] and would like her lead levels evaluated today. She also reports lack of energy and would like her labs rechecked today because she has not had her labs drawn in over 1 year.  .. Active Ambulatory  Problems    Diagnosis Date Noted  . Adenocarcinoma in situ of cervix 11/22/2015  . Benign cyst of breast 11/22/2015  . Essential hypertension, benign 11/22/2015  . Insulin resistance 11/22/2015  . Obesity 11/22/2015   Resolved Ambulatory Problems    Diagnosis Date Noted  . No Resolved Ambulatory Problems   Past Medical History:  Diagnosis Date  . Adenocarcinoma in situ of cervix 11/22/2015  . Benign cyst of breast 11/22/2015  . Essential hypertension, benign 11/22/2015  . Insulin resistance 11/22/2015  . Obesity 11/22/2015      Review of Systems  Eyes: Positive for redness (right eye). Negative for photophobia, pain, discharge, itching and visual disturbance.  Neurological: Negative for headaches.      Objective:   Physical Exam  Constitutional: She is oriented to person, place, and time. She appears well-developed and well-nourished.  HENT:  Head: Normocephalic and atraumatic.  Eyes: Conjunctivae, EOM and lids are normal. Pupils are equal, round, and reactive to light. Right eye exhibits no discharge. No foreign body present in the right eye. Left eye exhibits no discharge. No foreign body present in the left eye. No scleral icterus.  Bilateral funduscopic exam normal. Fluoroscien stain of right eye did not reveal scleral or corneal abrasions. Visual acuity with corrective lenses 20/30 on the right and 25/20 on the left. Patient reports stronger prescription on the left.  Cardiovascular: Normal rate, regular rhythm and normal  heart sounds.  Neurological: She is alert and oriented to person, place, and time.  Psychiatric: She has a normal mood and affect. Her behavior is normal.      Assessment & Plan:  .Marland Kitchen.Rachael Dickerson was seen today for right eye pain.  Diagnoses and all orders for this visit:  Eye pain, right  Essential hypertension, benign -     labetalol (NORMODYNE) 100 MG tablet; Take 1 tablet (100 mg total) by mouth 2 (two) times daily. -     COMPLETE METABOLIC PANEL WITH  GFR  Screening for lipid disorders -     Lipid Panel w/reflex Direct LDL  No energy -     CBC -     Vitamin D 1,25 dihydroxy -     B12  Exposure to lead -     Lead, blood   Right eye pain - No evidence of acute infection or corneal abrasion on exam today. Advised patient to follow-up with ophthalmology to further evaluate her sensation of irritation in her right eye. I suspect there could be some irritation from contacts. Discussed with patient that if she begins to experience increased pain, redness, or discharge from her eye to follow-up sooner for re-evaluation.   Hypertension -  Patients BP was elevated at 145/79 mmHg at the beginning of the visit but decreased to 136/78 mmHg at the end of the visit. Advised patient to continue taking her labetolol in the morning, but switch her second dose to before dinner instead of before bed. Discussed with patient that she should check her BP regularly and if it is not better controlled with this change in timing of her medications to follow-up for possible increase.  Health maintenance - Recheck fasting lipid panel and CMP. Recheck CBC, Vitamin D and B12 for history of decreased energy. Patient requested lead level due to lead exposure with her occupation.  Marland Kitchen..Spent 30 minutes with patient and greater than 50 percent of visit spent counseling patient regarding treatment plan.

## 2017-04-08 LAB — COMPLETE METABOLIC PANEL WITH GFR
AG Ratio: 1.7 (calc) (ref 1.0–2.5)
ALT: 23 U/L (ref 6–29)
AST: 17 U/L (ref 10–30)
Albumin: 4.3 g/dL (ref 3.6–5.1)
Alkaline phosphatase (APISO): 74 U/L (ref 33–115)
BUN: 10 mg/dL (ref 7–25)
CO2: 25 mmol/L (ref 20–32)
Calcium: 9.5 mg/dL (ref 8.6–10.2)
Chloride: 103 mmol/L (ref 98–110)
Creat: 0.7 mg/dL (ref 0.50–1.10)
GFR, Est African American: 128 mL/min/{1.73_m2} (ref >=60)
GFR, Est Non African American: 111 mL/min/{1.73_m2} (ref >=60)
Globulin: 2.6 g/dL (calc) (ref 1.9–3.7)
Glucose, Bld: 99 mg/dL (ref 65–99)
Potassium: 4.4 mmol/L (ref 3.5–5.3)
Sodium: 139 mmol/L (ref 135–146)
Total Bilirubin: 0.5 mg/dL (ref 0.2–1.2)
Total Protein: 6.9 g/dL (ref 6.1–8.1)

## 2017-04-08 LAB — CBC
HCT: 42.3 % (ref 35.0–45.0)
Hemoglobin: 14.2 g/dL (ref 11.7–15.5)
MCH: 30.1 pg (ref 27.0–33.0)
MCHC: 33.6 g/dL (ref 32.0–36.0)
MCV: 89.6 fL (ref 80.0–100.0)
MPV: 10.4 fL (ref 7.5–12.5)
Platelets: 455 10*3/uL — ABNORMAL HIGH (ref 140–400)
RBC: 4.72 10*6/uL (ref 3.80–5.10)
RDW: 12.4 % (ref 11.0–15.0)
WBC: 8.1 10*3/uL (ref 3.8–10.8)

## 2017-04-08 LAB — LIPID PANEL W/REFLEX DIRECT LDL
Cholesterol: 209 mg/dL — ABNORMAL HIGH (ref <200)
HDL: 50 mg/dL — ABNORMAL LOW (ref >50)
LDL Cholesterol (Calc): 138 mg/dL (calc) — ABNORMAL HIGH
Non-HDL Cholesterol (Calc): 159 mg/dL (calc) — ABNORMAL HIGH (ref <130)
Total CHOL/HDL Ratio: 4.2 (calc) (ref <5.0)
Triglycerides: 107 mg/dL (ref <150)

## 2017-04-08 LAB — LEAD, BLOOD (ADULT >= 16 YRS): Lead: 2 ug/dL (ref <5)

## 2017-04-08 LAB — VITAMIN D 1,25 DIHYDROXY
Vitamin D 1, 25 (OH)2 Total: 58 pg/mL (ref 18–72)
Vitamin D2 1, 25 (OH)2: <8 pg/mL
Vitamin D3 1, 25 (OH)2: 58 pg/mL

## 2017-04-08 LAB — VITAMIN B12: Vitamin B-12: 332 pg/mL (ref 200–1100)

## 2017-04-09 NOTE — Progress Notes (Signed)
Call pt: b12 low side of normal. Start supplement.  Kidney, liver, glucose look great.  Total Cholesterol has worsened a bit but HDL is MUCH better and TG are lower. Your cardiac risk is still low. Continue to work on lifestyle adjustments low fat diet and exercise. Recheck in one year.

## 2017-05-21 ENCOUNTER — Other Ambulatory Visit: Payer: Self-pay

## 2017-05-21 ENCOUNTER — Emergency Department (INDEPENDENT_AMBULATORY_CARE_PROVIDER_SITE_OTHER): Payer: BLUE CROSS/BLUE SHIELD

## 2017-05-21 ENCOUNTER — Encounter: Payer: Self-pay | Admitting: Emergency Medicine

## 2017-05-21 ENCOUNTER — Emergency Department (INDEPENDENT_AMBULATORY_CARE_PROVIDER_SITE_OTHER)
Admission: EM | Admit: 2017-05-21 | Discharge: 2017-05-21 | Disposition: A | Discharge status: Home / Self Care | Payer: BLUE CROSS/BLUE SHIELD | Source: Home / Self Care | Attending: Family Medicine | Admitting: Family Medicine

## 2017-05-21 DIAGNOSIS — K429 Umbilical hernia without obstruction or gangrene: Secondary | ICD-10-CM

## 2017-05-21 DIAGNOSIS — Z8541 Personal history of malignant neoplasm of cervix uteri: Secondary | ICD-10-CM

## 2017-05-21 DIAGNOSIS — R1031 Right lower quadrant pain: Secondary | ICD-10-CM | POA: Diagnosis not present

## 2017-05-21 LAB — POCT URINALYSIS DIP (MANUAL ENTRY)
Bilirubin, UA: NEGATIVE
Blood, UA: NEGATIVE
Glucose, UA: NEGATIVE mg/dL
Ketones, POC UA: NEGATIVE mg/dL
Nitrite, UA: NEGATIVE
Protein Ur, POC: NEGATIVE mg/dL
Spec Grav, UA: 1.015 (ref 1.010–1.025)
Urobilinogen, UA: 0.2 E.U./dL
pH, UA: 7.5 (ref 5.0–8.0)

## 2017-05-21 LAB — POCT CBC W AUTO DIFF (K'VILLE URGENT CARE)

## 2017-05-21 LAB — POCT URINE PREGNANCY: Preg Test, Ur: NEGATIVE

## 2017-05-21 MED ORDER — IOPAMIDOL (ISOVUE-300) INJECTION 61%
100.0000 mL | Freq: Once | INTRAVENOUS | Status: AC | PRN
  Administered 2017-05-21: 100 mL via INTRAVENOUS

## 2017-05-21 NOTE — ED Triage Notes (Signed)
Lower Right Quadrant pain x 2.5 days

## 2017-05-21 NOTE — Discharge Instructions (Addendum)
May take Ibuprofen for abdominal pain as needed. If symptoms become significantly worse during the night or over the weekend, proceed to the local emergency room.

## 2017-05-21 NOTE — ED Provider Notes (Signed)
[facility]    CSN: [id number] Arrival date & time: 05/21/17  1111     History   Chief Complaint Chief Complaint  Patient presents with  . Abdominal Pain    HPI Rachael Dickerson is a 37 y.o. female.   Patient developed vague right flank pain three days ago without urinary symptoms.  The pain improved, but today she awoke with mild right lower quadrant pain, worse with movement and bending over.  No fevers, chills, and sweats.  No nausea/vomiting.  No urinary symptoms.  No vaginal discharge or pelvic pain.  Patient's last menstrual period was 04/27/2017 (exact date).  She has a history of adenocarcinoma in situ of cervix in remission. Patient's last menstrual period was 04/27/2017 (exact date).    The history is provided by the patient.  Abdominal Pain  Pain location:  RLQ Pain quality: aching   Pain radiates to:  Does not radiate Pain severity:  Mild Onset quality:  Sudden Duration:  7 hours Timing:  Constant Progression:  Waxing and waning Chronicity:  New Context: awakening from sleep and previous surgery   Context: not diet changes, not eating and not recent illness   Relieved by:  None tried Worsened by:  Movement Ineffective treatments:  None tried Associated symptoms: no anorexia, no chest pain, no chills, no cough, no diarrhea, no dysuria, no fatigue, no fever, no hematemesis, no hematochezia, no hematuria, no melena, no nausea, no vaginal bleeding, no vaginal discharge and no vomiting     Past Medical History:  Diagnosis Date  . Adenocarcinoma in situ of cervix 11/22/2015   2012. Last pap was 11/16/2015 and cells were normal.   . Benign cyst of breast 11/22/2015  . Essential hypertension, benign 11/22/2015  . Insulin resistance 11/22/2015  . Obesity 11/22/2015    Patient Active Problem List   Diagnosis Date Noted  . Adenocarcinoma in situ of cervix 11/22/2015  . Benign cyst of breast 11/22/2015  . Essential hypertension, benign 11/22/2015  . Insulin  resistance 11/22/2015  . Obesity 11/22/2015    Past Surgical History:  Procedure Laterality Date  . TONSILLECTOMY      OB History    Gravida Para Term Preterm AB Living   0 0 0 0 0 0   SAB TAB Ectopic Multiple Live Births   0 0 0 0 0       Home Medications    Prior to Admission medications   Medication Sig Start Date End Date Taking? Authorizing Provider  labetalol (NORMODYNE) 100 MG tablet Take 1 tablet (100 mg total) by mouth 2 (two) times daily. 03/25/17   Jomarie Longs, PA-C    Family History Family History  Problem Relation Age of Onset  . Hypertension Mother   . Cancer Maternal Grandmother        melanoma    Social History Social History   Tobacco Use  . Smoking status: Former Smoker    Packs/day: 1.00    Years: 20.00    Pack years: 20.00    Types: Cigarettes  . Smokeless tobacco: Never Used  Substance Use Topics  . Alcohol use: Yes  . Drug use: No     Allergies   Erythromycin and Latex   Review of Systems Review of Systems  Constitutional: Negative for chills, fatigue and fever.  Respiratory: Negative for cough.   Cardiovascular: Negative for chest pain.  Gastrointestinal: Positive for abdominal pain. Negative for anorexia, diarrhea, hematemesis, hematochezia, melena, nausea and vomiting.  Genitourinary: Negative for  dysuria, hematuria, vaginal bleeding and vaginal discharge.  All other systems reviewed and are negative.    Physical Exam Triage Vital Signs ED Triage Vitals  Enc Vitals Group     BP 05/21/17 1230 133/84     Pulse Rate 05/21/17 1230 79     Resp --      Temp 05/21/17 1230 98.3 F (36.8 C)     Temp Source 05/21/17 1230 Oral     SpO2 05/21/17 1230 98 %     Weight 05/21/17 1231 190 lb (86.2 kg)     Height 05/21/17 1231 5\' 3"  (1.6 m)     Head Circumference --      Peak Flow --      Pain Score 05/21/17 1231 4     Pain Loc --      Pain Edu? --      Excl. in GC? --    No data found.  Updated Vital Signs BP 133/84  (BP Location: Right Arm)   Pulse 79   Temp 98.3 F (36.8 C) (Oral)   Ht 5\' 3"  (1.6 m)   Wt 190 lb (86.2 kg)   LMP 04/27/2017 (Exact Date)   SpO2 98%   BMI 33.66 kg/m   Visual Acuity Right Eye Distance:   Left Eye Distance:   Bilateral Distance:    Right Eye Near:   Left Eye Near:    Bilateral Near:     Physical Exam  Constitutional: She appears well-developed and well-nourished. No distress.  HENT:  Head: Normocephalic.  Right Ear: External ear normal.  Left Ear: External ear normal.  Nose: Nose normal.  Mouth/Throat: Oropharynx is clear and moist.  Eyes: Conjunctivae are normal. Pupils are equal, round, and reactive to light.  Neck: Neck supple.  Cardiovascular: Normal heart sounds.  Pulmonary/Chest: Breath sounds normal.  Abdominal: Soft. Normal appearance and bowel sounds are normal. She exhibits no distension and no mass. There is no hepatosplenomegaly. There is tenderness in the right lower quadrant. There is tenderness at McBurney's point. There is no rigidity, no rebound, no guarding and negative Murphy's sign. No hernia.    No flank or CVA tenderness palpatied. Negative iliopsoas and obdurator tests.  Lymphadenopathy:    She has no cervical adenopathy.  Neurological: She is alert.  Skin: Skin is warm and dry. No rash noted.  Nursing note and vitals reviewed.    UC Treatments / Results  Labs (all labs ordered are listed, but only abnormal results are displayed) Labs Reviewed  POCT URINALYSIS DIP (MANUAL ENTRY) - Abnormal; Notable for the following components:      Result Value   Leukocytes, UA Trace (*)    All other components within normal limits  POCT URINE PREGNANCY negative  POCT CBC W AUTO DIFF ([facility]):  WBC 11.6; LY 26.2; MO 5.5; GR 68.3; Hgb 15.0; Platelets 353     EKG  EKG Interpretation None       Radiology Ct Abdomen Pelvis W Contrast  Result Date: 05/21/2017 CLINICAL DATA:  RIGHT lower quadrant pain for 3 days,  tenderness over McBurney's point, question appendicitis, history of adenocarcinoma in situ of cervix in remission, hypertension, former smoker EXAM: CT ABDOMEN AND PELVIS WITH CONTRAST TECHNIQUE: Multidetector CT imaging of the abdomen and pelvis was performed using the standard protocol following bolus administration of intravenous contrast. Sagittal and coronal MPR images reconstructed from axial data set. CONTRAST:  ISOVUE-300 IOPAMIDOL (ISOVUE-300) INJECTION 61% IV. Dilute oral contrast. COMPARISON:  None FINDINGS:  Lower chest: Lung bases clear Hepatobiliary: Gallbladder and liver normal appearance Pancreas: Normal appearance Spleen: Normal appearance. Adrenals/Urinary Tract: Adrenal glands, kidneys, ureters, and bladder normal appearance Stomach/Bowel: Normal appendix, retrocecal. Stomach and bowel loops otherwise normal appearance. Vascular/Lymphatic: Unremarkable Reproductive: Normal appearance of uterus and ovaries Other: No free air or free fluid. Small umbilical hernia containing fat. Musculoskeletal: Unremarkable IMPRESSION: Small umbilical hernia containing fat. Otherwise negative exam. Specifically, appendix is normal in appearance. Electronically Signed   By: Ulyses SouthwardMark  Boles M.D.   On: 05/21/2017 15:18    Procedures Procedures (including critical care time)  Medications Ordered in UC Medications - No data to display   Initial Impression / Assessment and Plan / UC Course  I have reviewed the triage vital signs and the nursing notes.  Pertinent labs & imaging results that were available during my care of the patient were reviewed by me and considered in my medical decision making (see chart for details).    Etiology of pain not clear. May take Ibuprofen for abdominal pain as needed. Followup with Family Doctor in approximately 3 to 5 days. If symptoms become significantly worse during the night or over the weekend, proceed to the local emergency room.    Final Clinical  Impressions(s) / UC Diagnoses   Final diagnoses:  Right lower quadrant abdominal pain    ED Discharge Orders    None           Lattie HawBeese, Shanele Nissan A, MD 05/24/17 2023

## 2017-05-22 ENCOUNTER — Ambulatory Visit: Payer: BLUE CROSS/BLUE SHIELD | Admitting: Family Medicine

## 2017-05-22 LAB — URINE CULTURE
MICRO NUMBER:: 90160805
SPECIMEN QUALITY:: ADEQUATE

## 2017-05-23 ENCOUNTER — Telehealth: Payer: Self-pay | Admitting: *Deleted

## 2017-05-23 NOTE — Telephone Encounter (Signed)
LM with Ucx results and to call back if she has any questions or concerns.  

## 2017-06-30 ENCOUNTER — Ambulatory Visit (INDEPENDENT_AMBULATORY_CARE_PROVIDER_SITE_OTHER): Payer: BLUE CROSS/BLUE SHIELD | Admitting: Physician Assistant

## 2017-06-30 ENCOUNTER — Encounter: Payer: Self-pay | Admitting: Physician Assistant

## 2017-06-30 VITALS — BP 136/91 | HR 67 | Temp 97.5°F | Wt 189.0 lb

## 2017-06-30 DIAGNOSIS — R300 Dysuria: Secondary | ICD-10-CM

## 2017-06-30 LAB — POCT URINALYSIS DIPSTICK
Bilirubin, UA: NEGATIVE
Glucose, UA: 100
Ketones, UA: NEGATIVE
Nitrite, UA: POSITIVE
Spec Grav, UA: 1.01 (ref 1.010–1.025)
Urobilinogen, UA: 1 E.U./dL
pH, UA: 7 (ref 5.0–8.0)

## 2017-06-30 LAB — POCT URINE PREGNANCY: Preg Test, Ur: NEGATIVE

## 2017-06-30 MED ORDER — NITROFURANTOIN MONOHYD MACRO 100 MG PO CAPS: 100.0000 mg | ORAL_CAPSULE | Freq: Two times a day (BID) | ORAL | 0 refills | Status: AC

## 2017-06-30 NOTE — Progress Notes (Signed)
HPI:                                                                Rachael Dickerson is a 37 y.o. female who presents to [facility]: [facility] today for dysuria  Dysuria   This is a new problem. The current episode started today. The problem occurs every urination. The quality of the pain is described as burning. The pain is mild. There has been no fever. She is sexually active. There is no history of pyelonephritis. Associated symptoms include urgency. Pertinent negatives include no discharge, flank pain, frequency, hematuria, nausea, possible pregnancy or vomiting. Treatments tried: Azo. The treatment provided moderate relief. Her past medical history is significant for recurrent UTIs. There is no history of kidney stones or urinary stasis.    No flowsheet data found.    Past Medical History:  Diagnosis Date  . Adenocarcinoma in situ of cervix 11/22/2015   2012. Last pap was 11/16/2015 and cells were normal.   . Benign cyst of breast 11/22/2015  . Essential hypertension, benign 11/22/2015  . Insulin resistance 11/22/2015  . Obesity 11/22/2015   Past Surgical History:  Procedure Laterality Date  . TONSILLECTOMY     Social History   Tobacco Use  . Smoking status: Former Smoker    Packs/day: 1.00    Years: 20.00    Pack years: 20.00    Types: Cigarettes  . Smokeless tobacco: Never Used  Substance Use Topics  . Alcohol use: Yes   family history includes Cancer in her maternal grandmother; Hypertension in her mother.    ROS: negative except as noted in the HPI  Medications: Current Outpatient Medications  Medication Sig Dispense Refill  . labetalol (NORMODYNE) 100 MG tablet Take 1 tablet (100 mg total) by mouth 2 (two) times daily. 180 tablet 3  . nitrofurantoin, macrocrystal-monohydrate, (MACROBID) 100 MG capsule Take 1 capsule (100 mg total) by mouth 2 (two) times daily for 5 days. 10 capsule 0   No current facility-administered medications  for this visit.    Allergies  Allergen Reactions  . Erythromycin   . Latex        Objective:  BP (!) 136/91   Pulse 67   Temp (!) 97.5 F (36.4 C) (Oral)   Wt 189 lb (85.7 kg)   LMP 06/27/2017 (Exact Date)   BMI 33.48 kg/m  Gen:  alert, not ill-appearing, no distress, appropriate for age HEENT: head normocephalic without obvious abnormality, conjunctiva and cornea clear, trachea midline Pulm: Normal work of breathing, normal phonation Abdomen: soft, nontender, no rebound or rigidity, no CVA tenderness Neuro: alert and oriented x 3, no tremor MSK: extremities atraumatic, normal gait and station Skin: intact, no rashes on exposed skin, no jaundice, no cyanosis     Results for orders placed or performed in visit on 06/30/17 (from the past 72 hour(s))  POCT Urinalysis Dipstick     Status: Abnormal   Collection Time: 06/30/17  4:31 PM  Result Value Ref Range   Color, UA orange    Clarity, UA clear    Glucose, UA 100    Bilirubin, UA negative    Ketones, UA negative    Spec Grav, UA 1.010 1.010 - 1.025   Blood, UA  large    pH, UA 7.0 5.0 - 8.0   Protein, UA trace    Urobilinogen, UA 1.0 0.2 or 1.0 E.U./dL   Nitrite, UA positive    Leukocytes, UA Large (3+) (A) Negative   Appearance     Odor    POCT urine pregnancy     Status: Normal   Collection Time: 06/30/17  4:31 PM  Result Value Ref Range   Preg Test, Ur Negative Negative   No results found.    Assessment and Plan: 37 y.o. female with   1. Dysuria - POCT Urinalysis Dipstick positive for large leuks, nitrite and large blood, however patient took Azo which may have altered to UA - POCT urine pregnancy neg - Urine Culture pending - no fever or CVA tenderness. treating empirically for uncomplicated cystitis with Macrobid - nitrofurantoin, macrocrystal-monohydrate, (MACROBID) 100 MG capsule; Take 1 capsule (100 mg total) by mouth 2 (two) times daily for 5 days.  Dispense: 10 capsule; Refill: 0   Patient  education and anticipatory guidance given Patient agrees with treatment plan Follow-up as needed if symptoms worsen or fail to improve  Levonne Hubertharley E. Cummings PA-C

## 2017-07-02 LAB — URINE CULTURE
MICRO NUMBER:: 90339343
SPECIMEN QUALITY:: ADEQUATE

## 2017-07-02 NOTE — Progress Notes (Signed)
Good morning Rachael DikeJennifer  Your urine culture was inconclusive and possibly contaminated with vaginal bacteria If you are still having symptoms, it is okay to finish the antibiotic If you are still symptomatic after the antibiotic, recommend you return for a follow-up with your PCP Let me know if you have any questions or concerns Best, Vinetta Bergamoharley

## 2017-11-24 ENCOUNTER — Encounter: Payer: Self-pay | Admitting: Physician Assistant

## 2017-11-24 MED ORDER — SCOPOLAMINE 1 MG/3DAYS TD PT72: 1.0000 | MEDICATED_PATCH | TRANSDERMAL | 0 refills | Status: DC

## 2017-12-24 ENCOUNTER — Encounter: Payer: Self-pay | Admitting: Physician Assistant

## 2017-12-24 ENCOUNTER — Ambulatory Visit (INDEPENDENT_AMBULATORY_CARE_PROVIDER_SITE_OTHER): Payer: BLUE CROSS/BLUE SHIELD | Admitting: Physician Assistant

## 2017-12-24 VITALS — BP 127/86 | HR 93 | Temp 98.0°F | Ht 63.0 in | Wt 185.0 lb

## 2017-12-24 DIAGNOSIS — J069 Acute upper respiratory infection, unspecified: Secondary | ICD-10-CM | POA: Diagnosis not present

## 2017-12-24 DIAGNOSIS — H6983 Other specified disorders of Eustachian tube, bilateral: Secondary | ICD-10-CM

## 2017-12-24 DIAGNOSIS — B9789 Other viral agents as the cause of diseases classified elsewhere: Secondary | ICD-10-CM | POA: Diagnosis not present

## 2017-12-24 MED ORDER — METHYLPREDNISOLONE 4 MG PO TBPK: ORAL_TABLET | ORAL | 0 refills | Status: DC

## 2017-12-24 MED ORDER — AMOXICILLIN-POT CLAVULANATE 875-125 MG PO TABS: 1.0000 | ORAL_TABLET | Freq: Two times a day (BID) | ORAL | 0 refills | Status: DC

## 2017-12-24 NOTE — Patient Instructions (Signed)
Eustachian Tube Dysfunction The eustachian tube connects the middle ear to the back of the nose. It regulates air pressure in the middle ear by allowing air to move between the ear and nose. It also helps to drain fluid from the middle ear space. When the eustachian tube does not function properly, air pressure, fluid, or both can build up in the middle ear. Eustachian tube dysfunction can affect one or both ears. What are the causes? This condition happens when the eustachian tube becomes blocked or cannot open normally. This may result from:  Ear infections.  Colds and other upper respiratory infections.  Allergies.  Irritation, such as from cigarette smoke or acid from the stomach coming up into the esophagus (gastroesophageal reflux).  Sudden changes in air pressure, such as from descending in an airplane.  Abnormal growths in the nose or throat, such as nasal polyps, tumors, or enlarged tissue at the back of the throat (adenoids).  What increases the risk? This condition may be more likely to develop in people who smoke and people who are overweight. Eustachian tube dysfunction may also be more likely to develop in children, especially children who have:  Certain birth defects of the mouth, such as cleft palate.  Large tonsils and adenoids.  What are the signs or symptoms? Symptoms of this condition may include:  A feeling of fullness in the ear.  Ear pain.  Clicking or popping noises in the ear.  Ringing in the ear.  Hearing loss.  Loss of balance.  Symptoms may get worse when the air pressure around you changes, such as when you travel to an area of high elevation or fly on an airplane. How is this diagnosed? This condition may be diagnosed based on:  Your symptoms.  A physical exam of your ear, nose, and throat.  Tests, such as those that measure: ? The movement of your eardrum (tympanogram). ? Your hearing (audiometry).  How is this treated? Treatment  depends on the cause and severity of your condition. If your symptoms are mild, you may be able to relieve your symptoms by moving air into ("popping") your ears. If you have symptoms of fluid in your ears, treatment may include:  Decongestants.  Antihistamines.  Nasal sprays or ear drops that contain medicines that reduce swelling (steroids).  In some cases, you may need to have a procedure to drain the fluid in your eardrum (myringotomy). In this procedure, a small tube is placed in the eardrum to:  Drain the fluid.  Restore the air in the middle ear space.  Follow these instructions at home:  Take over-the-counter and prescription medicines only as told by your health care provider.  Use techniques to help pop your ears as recommended by your health care provider. These may include: ? Chewing gum. ? Yawning. ? Frequent, forceful swallowing. ? Closing your mouth, holding your nose closed, and gently blowing as if you are trying to blow air out of your nose.  Do not do any of the following until your health care provider approves: ? Travel to high altitudes. ? Fly in airplanes. ? Work in a pressurized cabin or room. ? Scuba dive.  Keep your ears dry. Dry your ears completely after showering or bathing.  Do not smoke.  Keep all follow-up visits as told by your health care provider. This is important. Contact a health care provider if:  Your symptoms do not go away after treatment.  Your symptoms come back after treatment.  You are   unable to pop your ears.  You have: ? A fever. ? Pain in your ear. ? Pain in your head or neck. ? Fluid draining from your ear.  Your hearing suddenly changes.  You become very dizzy.  You lose your balance. This information is not intended to replace advice given to you by your health care provider. Make sure you discuss any questions you have with your health care provider. Document Released: 04/28/2015 Document Revised: 09/07/2015  Document Reviewed: 04/20/2014 Elsevier Interactive Patient Education  2018 Elsevier Inc.  

## 2017-12-24 NOTE — Progress Notes (Signed)
   Subjective:    Patient ID: Rachael Dickerson, female    DOB: [birth year], 37 y.o.   MRN: [id number]  HPI  Pt is a 37 yo female who presents to the clinic with cough, ST, ear pressure(mostly right), fatigue. She recently returned from a cruise. She has had symptoms for 5 days. She is taking dayquil, nyquil, and mucinex. She has had some relief but not much. No fever, chills, body aches, SOB or wheezing.   .. Active Ambulatory Problems    Diagnosis Date Noted  . Adenocarcinoma in situ of cervix 11/22/2015  . Benign cyst of breast 11/22/2015  . Essential hypertension, benign 11/22/2015  . Insulin resistance 11/22/2015  . Obesity 11/22/2015   Resolved Ambulatory Problems    Diagnosis Date Noted  . No Resolved Ambulatory Problems   No Additional Past Medical History      Review of Systems    see HPI.  Objective:   Physical Exam  Constitutional: She is oriented to person, place, and time. She appears well-developed and well-nourished.  HENT:  Head: Normocephalic and atraumatic.  Right Ear: External ear normal.  Left Ear: External ear normal.  Oropharynx erythematous no exudate.  Bilateral nares red and swollen.  Right TM erythematous with some bulging.  Negative for sinus pressure/tenderness.   Eyes: Pupils are equal, round, and reactive to light. Conjunctivae and EOM are normal.  Neck:  Bilateral tender adenopathy.   Cardiovascular: Normal rate and regular rhythm.  Lymphadenopathy:    She has cervical adenopathy.  Neurological: She is alert and oriented to person, place, and time.  Psychiatric: She has a normal mood and affect. Her behavior is normal.          Assessment & Plan:  Marland KitchenMarland KitchenDiagnoses and all orders for this visit:  Viral URI with cough -     amoxicillin-clavulanate (AUGMENTIN) 875-125 MG tablet; Take 1 tablet by mouth 2 (two) times daily. -     methylPREDNISolone (MEDROL DOSEPAK) 4 MG TBPK tablet; Take as directed by package insert.  Dysfunction of both  eustachian tubes -     amoxicillin-clavulanate (AUGMENTIN) 875-125 MG tablet; Take 1 tablet by mouth 2 (two) times daily. -     methylPREDNISolone (MEDROL DOSEPAK) 4 MG TBPK tablet; Take as directed by package insert.  symptoms appear viral;however, due to longevity of 5 days printed off augmentin to start if symptoms don't improve in the next 23-48 hours. HO given. Start medrol dose pack. Consider flonase for next few days. Follow up as needed.

## 2018-01-20 ENCOUNTER — Encounter: Payer: Self-pay | Admitting: Physician Assistant

## 2018-02-02 ENCOUNTER — Ambulatory Visit (INDEPENDENT_AMBULATORY_CARE_PROVIDER_SITE_OTHER): Payer: BLUE CROSS/BLUE SHIELD | Admitting: Obstetrics & Gynecology

## 2018-02-02 ENCOUNTER — Encounter: Payer: Self-pay | Admitting: Obstetrics & Gynecology

## 2018-02-02 VITALS — BP 147/97 | HR 106 | Ht 62.0 in | Wt 195.0 lb

## 2018-02-02 DIAGNOSIS — Z8541 Personal history of malignant neoplasm of cervix uteri: Secondary | ICD-10-CM

## 2018-02-02 DIAGNOSIS — O10919 Unspecified pre-existing hypertension complicating pregnancy, unspecified trimester: Secondary | ICD-10-CM | POA: Insufficient documentation

## 2018-02-02 DIAGNOSIS — I10 Essential (primary) hypertension: Secondary | ICD-10-CM

## 2018-02-02 DIAGNOSIS — E3452 Partial androgen insensitivity syndrome: Secondary | ICD-10-CM | POA: Diagnosis not present

## 2018-02-02 NOTE — Progress Notes (Signed)
PT here to discuss getting pregnant- pt states that her and her husband have been trying for over a year  Repeat BP 156/98.

## 2018-02-02 NOTE — Progress Notes (Signed)
   Subjective:    Patient ID: Rachael Dickerson, female    DOB: [birth year], 37 y.o.   MRN: [id number]  HPI 37 year old female presents for evaluation for pregnancy.  Patient was treated for adenocarcinoma in situ with a cold knife cone.  She is been followed yearly with Pap smears and ECCs.  She is not had any other biopsies or cervical cone procedures.  Patient has been cleared by GYN oncology for pregnancy.  We are requesting her records.  Patient has chronic hypertension that is usually well controlled.  She takes her blood pressure at home and is not a problem.  It is elevated here today.  She is on labetalol 100 mg twice a day.  Patient has regular menstrual cycles with breast tenderness before her cycle begins.  Husband is known to have female factor infertility with morphology and motility.   Review of Systems  Constitutional: Negative.   Respiratory: Negative.   Cardiovascular: Negative.   Gastrointestinal: Negative.   Genitourinary: Negative for menstrual problem.  Psychiatric/Behavioral: Negative.        Objective:   Physical Exam  Constitutional: She is oriented to person, place, and time. She appears well-developed and well-nourished. No distress.  HENT:  Head: Normocephalic and atraumatic.  Eyes: Conjunctivae are normal.  Cardiovascular: Normal rate.  Pulmonary/Chest: Effort normal.  Musculoskeletal: She exhibits no edema.  Neurological: She is alert and oriented to person, place, and time.  Skin: Skin is warm and dry.  Psychiatric: She has a normal mood and affect.  Vitals reviewed.  Vitals:   02/02/18 1424  BP: (!) 147/97  Pulse: (!) 106  Weight: 195 lb (88.5 kg)  Height: 5\' 2"  (1.575 m)   Assessment & Plan:  37 year old female with history of adenocarcinoma in situ and infertility.  1.  Since has been known to have female factor infertility.  Will refer to Dr. April Manson for evaluation and treatment. 2.  Patient to come to [place] before infertility treatment start  for a Pap smear and ECC.  Patient has been cleared by GYN oncology and no one and in [place] for pregnancy. 3.  Continue vitamins with folic acid 4.  Continue labetalol 100 mg p.o. twice daily.  Patient should also continue taking blood pressure at home.  30 minutes spent face-to-face with patient with greater than 50% counseling.

## 2018-02-05 ENCOUNTER — Telehealth: Payer: BLUE CROSS/BLUE SHIELD | Admitting: Family

## 2018-02-05 DIAGNOSIS — B373 Candidiasis of vulva and vagina: Secondary | ICD-10-CM

## 2018-02-05 DIAGNOSIS — B3731 Acute candidiasis of vulva and vagina: Secondary | ICD-10-CM

## 2018-02-05 MED ORDER — FLUCONAZOLE 150 MG PO TABS: 150.0000 mg | ORAL_TABLET | Freq: Once | ORAL | 0 refills | Status: AC

## 2018-02-05 NOTE — Progress Notes (Signed)

## 2018-02-24 DIAGNOSIS — Z3169 Encounter for other general counseling and advice on procreation: Secondary | ICD-10-CM | POA: Diagnosis not present

## 2018-02-25 ENCOUNTER — Telehealth: Payer: Self-pay | Admitting: *Deleted

## 2018-02-25 NOTE — Telephone Encounter (Signed)
Received a call from Prudencio BurlyKristen Cain MD [facility] new RE.  She called regarding the referral for pt.  Spouse had to go through IVF with his first marriage due to a very low sperm count.  Couple were given opt of doing 2 IUI cycles or IVF.  They are aware that there is only about 1 % that they will conceive without IVF.

## 2018-04-15 HISTORY — PX: OTHER SURGICAL HISTORY: SHX169

## 2018-04-27 ENCOUNTER — Encounter: Payer: Self-pay | Admitting: Physician Assistant

## 2018-04-27 DIAGNOSIS — I10 Essential (primary) hypertension: Secondary | ICD-10-CM

## 2018-04-27 MED ORDER — LABETALOL HCL 100 MG PO TABS: 100.0000 mg | ORAL_TABLET | Freq: Two times a day (BID) | ORAL | 0 refills | Status: DC

## 2018-04-28 MED ORDER — LABETALOL HCL 100 MG PO TABS: 100.0000 mg | ORAL_TABLET | Freq: Two times a day (BID) | ORAL | 0 refills | Status: DC

## 2018-04-28 NOTE — Addendum Note (Signed)
Addended by: Sandrea MatteERSKINE, Terrina Docter R on: 04/28/2018 08:33 AM   Modules accepted: Orders

## 2018-05-07 ENCOUNTER — Ambulatory Visit: Payer: BLUE CROSS/BLUE SHIELD | Admitting: Physician Assistant

## 2018-05-12 ENCOUNTER — Encounter: Payer: Self-pay | Admitting: Physician Assistant

## 2018-05-12 ENCOUNTER — Ambulatory Visit (INDEPENDENT_AMBULATORY_CARE_PROVIDER_SITE_OTHER): Payer: BLUE CROSS/BLUE SHIELD | Admitting: Physician Assistant

## 2018-05-12 VITALS — BP 146/93 | HR 76 | Ht 62.0 in | Wt 201.0 lb

## 2018-05-12 DIAGNOSIS — M7711 Lateral epicondylitis, right elbow: Secondary | ICD-10-CM

## 2018-05-12 DIAGNOSIS — I10 Essential (primary) hypertension: Secondary | ICD-10-CM

## 2018-05-12 MED ORDER — LABETALOL HCL 200 MG PO TABS: 200.0000 mg | ORAL_TABLET | Freq: Two times a day (BID) | ORAL | 2 refills | Status: DC

## 2018-05-12 MED ORDER — DICLOFENAC SODIUM 1 % TD GEL: 4.0000 g | Freq: Four times a day (QID) | TRANSDERMAL | 1 refills | Status: DC

## 2018-05-12 NOTE — Progress Notes (Deleted)
   Subjective:    Patient ID: Rachael Dickerson, female    DOB: Dec 22, 1980, 38 y.o.   MRN: [id number]  HPI  Home chilling 130/80  Review of Systems     Objective:   Physical Exam        Assessment & Plan:

## 2018-05-12 NOTE — Progress Notes (Signed)
   Subjective:    Patient ID: Rachael Dickerson, female    DOB: 1980-06-04, 38 y.o.   MRN: [id number]  HPI Pt is a 38 yo female with HTN who presents to the clinic to follow up on HTN. She is taking labetolol bid. Most of the time feels like it is working but feels like it wears off fast. No CP, palpitations, headaches or vision changes. She has been checking BP at home and ranging 130's over 80's. She is getting ready to embark on IVF.   She is having some intermittent tenderness over lateral elbow and with certain movements. No injury. Not tried anything to make better.   .. Active Ambulatory Problems    Diagnosis Date Noted  . Adenocarcinoma in situ of cervix 11/22/2015  . Benign cyst of breast 11/22/2015  . Essential hypertension, benign 11/22/2015  . Insulin resistance 11/22/2015  . Obesity 11/22/2015  . Chronic hypertension 02/02/2018  . History of cervical cancer 02/02/2018  . Infertile female syndrome 02/02/2018   Resolved Ambulatory Problems    Diagnosis Date Noted  . No Resolved Ambulatory Problems   No Additional Past Medical History      Review of Systems See HPI.     Objective:   Physical Exam Vitals signs reviewed.  Constitutional:      Appearance: Normal appearance.  HENT:     Head: Normocephalic and atraumatic.  Cardiovascular:     Rate and Rhythm: Normal rate and regular rhythm.  Pulmonary:     Effort: Pulmonary effort is normal.     Breath sounds: Normal breath sounds.  Musculoskeletal:     Comments: Tenderness over lateral right epicondyle.  Strength 5/5.  ROM of right elbow normal.  No bruising or swelling.   Neurological:     General: No focal deficit present.     Mental Status: She is alert and oriented to person, place, and time.  Psychiatric:        Mood and Affect: Mood normal.        Behavior: Behavior normal.           Assessment & Plan:  .Marland KitchenSheenah was seen today for medication refill.  Diagnoses and all orders for this  visit:  Essential hypertension, benign -     labetalol (NORMODYNE) 200 MG tablet; Take 1 tablet (200 mg total) by mouth 2 (two) times daily.  Lateral epicondylitis of right elbow -     diclofenac sodium (VOLTAREN) 1 % GEL; Apply 4 g topically 4 (four) times daily. To affected joint.   BP not to goal. Pt is desiring pregnancy and need to stick with labetalol. Will increase dose to 200mg  bid. Continue to monitor BP at home and at [facility] appts. Goal BP under 130/90. Follow up in 3 months.    Use Voltaren cream over painful area. Consider air bubble strap under elbow. Ice as needed. Follow up as needed.

## 2018-05-13 DIAGNOSIS — Z3181 Encounter for male factor infertility in female patient: Secondary | ICD-10-CM | POA: Diagnosis not present

## 2018-05-13 DIAGNOSIS — I10 Essential (primary) hypertension: Secondary | ICD-10-CM | POA: Diagnosis not present

## 2018-05-13 DIAGNOSIS — N978 Female infertility of other origin: Secondary | ICD-10-CM | POA: Diagnosis not present

## 2018-05-19 ENCOUNTER — Encounter: Payer: Self-pay | Admitting: *Deleted

## 2018-05-19 DIAGNOSIS — Z1321 Encounter for screening for nutritional disorder: Secondary | ICD-10-CM | POA: Diagnosis not present

## 2018-05-19 DIAGNOSIS — Z3141 Encounter for fertility testing: Secondary | ICD-10-CM | POA: Diagnosis not present

## 2018-06-15 ENCOUNTER — Ambulatory Visit (INDEPENDENT_AMBULATORY_CARE_PROVIDER_SITE_OTHER): Payer: BLUE CROSS/BLUE SHIELD | Admitting: Obstetrics & Gynecology

## 2018-06-15 ENCOUNTER — Encounter: Payer: Self-pay | Admitting: Obstetrics & Gynecology

## 2018-06-15 ENCOUNTER — Other Ambulatory Visit (HOSPITAL_COMMUNITY)
Admission: RE | Admit: 2018-06-15 | Discharge: 2018-06-15 | Disposition: A | Discharge status: Home / Self Care | Payer: BLUE CROSS/BLUE SHIELD | Source: Ambulatory Visit | Attending: Obstetrics & Gynecology | Admitting: Obstetrics & Gynecology

## 2018-06-15 VITALS — BP 149/93 | HR 94 | Resp 16 | Ht 62.0 in | Wt 201.0 lb

## 2018-06-15 DIAGNOSIS — Z01419 Encounter for gynecological examination (general) (routine) without abnormal findings: Secondary | ICD-10-CM | POA: Diagnosis not present

## 2018-06-15 DIAGNOSIS — Z1151 Encounter for screening for human papillomavirus (HPV): Secondary | ICD-10-CM

## 2018-06-15 DIAGNOSIS — Z124 Encounter for screening for malignant neoplasm of cervix: Secondary | ICD-10-CM | POA: Diagnosis not present

## 2018-06-15 NOTE — Progress Notes (Signed)
Pt declines UPT before her ECC

## 2018-06-15 NOTE — Progress Notes (Signed)
Subjective:     Rachael Dickerson is a 38 y.o. female here for a routine exam.  Current complaints: undergoing IVF with PGD later this month.  BP at home is nml.  BP here is elevated.     Gynecologic History Patient's last menstrual period was 06/08/2018. Contraception: OCP (estrogen/progesterone) Last Pap: nml per patient (was treated for AIS by gyn onc) Last mammogram: due at age 77  Obstetric History OB History  Gravida Para Term Preterm AB Living  0 0 0 0 0 0  SAB TAB Ectopic Multiple Live Births  0 0 0 0 0     The following portions of the patient's history were reviewed and updated as appropriate: allergies, current medications, past family history, past medical history, past social history, past surgical history and problem list.  Review of Systems Pertinent items noted in HPI and remainder of comprehensive ROS otherwise negative.    Objective:      Vitals:   06/15/18 1441  BP: (!) 149/93  Pulse: 94  Resp: 16  Weight: 201 lb (91.2 kg)  Height: 5\' 2"  (1.575 m)   Vitals:  WNL General appearance: alert, cooperative and no distress  HEENT: Normocephalic, without obvious abnormality, atraumatic Eyes: negative Throat: lips, mucosa, and tongue normal; teeth and gums normal  Respiratory: Clear to auscultation bilaterally  CV: Regular rate and rhythm  Breasts:  Normal appearance, no masses or tenderness, no nipple retraction or dimpling  GI: Soft, non-tender; bowel sounds normal; no masses,  no organomegaly  GU: External Genitalia:  Tanner V, no lesion Urethra:  No prolapse   Vagina: Pink, normal rugae, no blood or discharge  Cervix: No CMT, no lesion  Uterus:  Normal size and contour, non tender  Adnexa: Normal, no masses, non tender  Musculoskeletal: No edema, redness or tenderness in the calves or thighs  Skin: No lesions or rash  Lymphatic: Axillary adenopathy: none     Psychiatric: Normal mood and behavior        Assessment:    Healthy female exam.     Plan:    Pap with co-testing ECC Pt to come in with BP cuff and make sure her home cuff is similar to our reading here.  Pt may have white coat HTN as all her home readings are nml. (110s/70s) F/U with REI

## 2018-06-18 ENCOUNTER — Encounter: Payer: Self-pay | Admitting: Physician Assistant

## 2018-06-18 LAB — CYTOLOGY - PAP
Diagnosis: NEGATIVE
HPV: DETECTED — AB

## 2018-06-19 MED ORDER — LABETALOL HCL 300 MG PO TABS: 300.0000 mg | ORAL_TABLET | Freq: Two times a day (BID) | ORAL | 1 refills | Status: DC

## 2018-06-23 ENCOUNTER — Encounter: Payer: Self-pay | Admitting: *Deleted

## 2018-08-11 ENCOUNTER — Other Ambulatory Visit: Payer: Self-pay | Admitting: Physician Assistant

## 2018-08-11 ENCOUNTER — Encounter: Payer: Self-pay | Admitting: Physician Assistant

## 2018-08-11 MED ORDER — LABETALOL HCL 300 MG PO TABS: 300.0000 mg | ORAL_TABLET | Freq: Two times a day (BID) | ORAL | 1 refills | Status: DC

## 2018-09-08 DIAGNOSIS — Z1159 Encounter for screening for other viral diseases: Secondary | ICD-10-CM | POA: Diagnosis not present

## 2018-09-11 DIAGNOSIS — Z3141 Encounter for fertility testing: Secondary | ICD-10-CM | POA: Diagnosis not present

## 2018-09-14 DIAGNOSIS — Z3141 Encounter for fertility testing: Secondary | ICD-10-CM | POA: Diagnosis not present

## 2018-09-16 DIAGNOSIS — Z3141 Encounter for fertility testing: Secondary | ICD-10-CM | POA: Diagnosis not present

## 2018-09-16 DIAGNOSIS — Z3183 Encounter for assisted reproductive fertility procedure cycle: Secondary | ICD-10-CM | POA: Diagnosis not present

## 2018-09-18 DIAGNOSIS — Z3141 Encounter for fertility testing: Secondary | ICD-10-CM | POA: Diagnosis not present

## 2018-09-22 DIAGNOSIS — N979 Female infertility, unspecified: Secondary | ICD-10-CM | POA: Diagnosis not present

## 2018-09-22 DIAGNOSIS — Z3183 Encounter for assisted reproductive fertility procedure cycle: Secondary | ICD-10-CM | POA: Diagnosis not present

## 2018-10-05 DIAGNOSIS — Z3141 Encounter for fertility testing: Secondary | ICD-10-CM | POA: Diagnosis not present

## 2018-10-12 DIAGNOSIS — Z3141 Encounter for fertility testing: Secondary | ICD-10-CM | POA: Diagnosis not present

## 2018-10-13 ENCOUNTER — Other Ambulatory Visit: Payer: Self-pay | Admitting: Physician Assistant

## 2018-10-16 DIAGNOSIS — Z3141 Encounter for fertility testing: Secondary | ICD-10-CM | POA: Diagnosis not present

## 2018-10-19 DIAGNOSIS — Z3141 Encounter for fertility testing: Secondary | ICD-10-CM | POA: Diagnosis not present

## 2018-10-26 DIAGNOSIS — N979 Female infertility, unspecified: Secondary | ICD-10-CM | POA: Diagnosis not present

## 2018-11-03 DIAGNOSIS — Z3200 Encounter for pregnancy test, result unknown: Secondary | ICD-10-CM | POA: Diagnosis not present

## 2018-11-05 DIAGNOSIS — Z3200 Encounter for pregnancy test, result unknown: Secondary | ICD-10-CM | POA: Diagnosis not present

## 2018-11-10 ENCOUNTER — Other Ambulatory Visit: Payer: Self-pay | Admitting: Physician Assistant

## 2018-11-30 ENCOUNTER — Other Ambulatory Visit: Payer: Self-pay | Admitting: Physician Assistant

## 2018-11-30 ENCOUNTER — Telehealth: Payer: Self-pay | Admitting: Physician Assistant

## 2018-11-30 MED ORDER — LABETALOL HCL 300 MG PO TABS: 300.0000 mg | ORAL_TABLET | Freq: Two times a day (BID) | ORAL | 0 refills | Status: DC

## 2018-11-30 NOTE — Telephone Encounter (Signed)
I sent over another 30 days if needed but yes we need labs and follow up(can be virtual)and then can give 1 year supply.

## 2018-11-30 NOTE — Telephone Encounter (Signed)
Patient made appointment for in office for first appointment of the day on Wednesday with PCP. Will do labs that day as well. No further questions at this time.

## 2018-11-30 NOTE — Telephone Encounter (Signed)
Patient states that she can not get her BP meds. She said she has been sending her BP readings through [facility] but wanted to know if she needed to do a visit to get a refill now. IF she needs a visit she would like to know if it can be virtual. Does not want to come in if at all possible. Please advise.

## 2018-12-02 ENCOUNTER — Other Ambulatory Visit: Payer: Self-pay

## 2018-12-02 ENCOUNTER — Ambulatory Visit (INDEPENDENT_AMBULATORY_CARE_PROVIDER_SITE_OTHER): Payer: BC Managed Care – PPO | Admitting: Physician Assistant

## 2018-12-02 ENCOUNTER — Encounter: Payer: Self-pay | Admitting: Physician Assistant

## 2018-12-02 VITALS — BP 128/82 | HR 89 | Temp 98.2°F | Ht 63.0 in | Wt 210.0 lb

## 2018-12-02 DIAGNOSIS — I10 Essential (primary) hypertension: Secondary | ICD-10-CM

## 2018-12-02 DIAGNOSIS — R110 Nausea: Secondary | ICD-10-CM

## 2018-12-02 DIAGNOSIS — Z3A08 8 weeks gestation of pregnancy: Secondary | ICD-10-CM | POA: Diagnosis not present

## 2018-12-02 DIAGNOSIS — K219 Gastro-esophageal reflux disease without esophagitis: Secondary | ICD-10-CM | POA: Diagnosis not present

## 2018-12-02 MED ORDER — LABETALOL HCL 300 MG PO TABS: 300.0000 mg | ORAL_TABLET | Freq: Two times a day (BID) | ORAL | 3 refills | Status: DC

## 2018-12-02 NOTE — Progress Notes (Signed)
   Subjective:    Patient ID: Rachael Dickerson, female    DOB: July 13, 1980, 38 y.o.   MRN: [id number]  HPI  Pt is a 38 yo [redacted] weeks pregnant female with HTN who presents to the clinic for BP follow up and medication refill.   Pt has been seeing fertility clinic and embryo was implanted 8 weeks ago. She has u/s tomorrow with fertility and then will have her follow up with OB. She is doing ok but having a lot of GERD symptoms and nausea.   She denies any problems with BP. She has had checked multiple times in the fertility clinic and was normal. She is very complaint with labetalol. No CP, palpitations, headaches, or vision changes.   .. Active Ambulatory Problems    Diagnosis Date Noted  . Adenocarcinoma in situ of cervix 11/22/2015  . Benign cyst of breast 11/22/2015  . Essential hypertension, benign 11/22/2015  . Insulin resistance 11/22/2015  . Obesity 11/22/2015  . Chronic hypertension 02/02/2018  . History of cervical cancer 02/02/2018  . Infertile female syndrome 02/02/2018  . [redacted] weeks gestation of pregnancy 12/02/2018   Resolved Ambulatory Problems    Diagnosis Date Noted  . No Resolved Ambulatory Problems   No Additional Past Medical History         Review of Systems  All other systems reviewed and are negative.      Objective:   Physical Exam Vitals signs reviewed.  Constitutional:      Appearance: Normal appearance.  HENT:     Head: Normocephalic.  Cardiovascular:     Rate and Rhythm: Normal rate and regular rhythm.     Pulses: Normal pulses.  Pulmonary:     Effort: Pulmonary effort is normal.     Breath sounds: Normal breath sounds.  Neurological:     General: No focal deficit present.     Mental Status: She is alert and oriented to person, place, and time.  Psychiatric:        Mood and Affect: Mood normal.           Assessment & Plan:  .Marland KitchenDyesha was seen today for hypertension.  Diagnoses and all orders for this visit:  Essential  hypertension, benign -     labetalol (NORMODYNE) 300 MG tablet; Take 1 tablet (300 mg total) by mouth 2 (two) times daily.  Gastroesophageal reflux disease, esophagitis presence not specified  Nausea  [redacted] weeks gestation of pregnancy   Refilled labetolol for 1 year.  Pt will get labs at first visit with OB which I can review.   Discussed OTC options for reflux/nausea. Pt can discuss dicelgis with OB at visit. Pt is on prenatals.

## 2018-12-03 DIAGNOSIS — Z3490 Encounter for supervision of normal pregnancy, unspecified, unspecified trimester: Secondary | ICD-10-CM | POA: Diagnosis not present

## 2018-12-07 ENCOUNTER — Encounter: Payer: Self-pay | Admitting: *Deleted

## 2018-12-07 NOTE — Progress Notes (Unsigned)
leg

## 2018-12-14 ENCOUNTER — Encounter: Payer: Self-pay | Admitting: Obstetrics & Gynecology

## 2018-12-14 ENCOUNTER — Other Ambulatory Visit: Payer: BC Managed Care – PPO

## 2018-12-14 ENCOUNTER — Other Ambulatory Visit: Payer: Self-pay

## 2018-12-14 ENCOUNTER — Ambulatory Visit (INDEPENDENT_AMBULATORY_CARE_PROVIDER_SITE_OTHER): Payer: BC Managed Care – PPO | Admitting: Obstetrics & Gynecology

## 2018-12-14 DIAGNOSIS — Z3401 Encounter for supervision of normal first pregnancy, first trimester: Secondary | ICD-10-CM

## 2018-12-14 DIAGNOSIS — Z3A09 9 weeks gestation of pregnancy: Secondary | ICD-10-CM

## 2018-12-14 DIAGNOSIS — Z113 Encounter for screening for infections with a predominantly sexual mode of transmission: Secondary | ICD-10-CM | POA: Diagnosis not present

## 2018-12-14 DIAGNOSIS — Z3400 Encounter for supervision of normal first pregnancy, unspecified trimester: Secondary | ICD-10-CM | POA: Diagnosis not present

## 2018-12-14 DIAGNOSIS — O0990 Supervision of high risk pregnancy, unspecified, unspecified trimester: Secondary | ICD-10-CM | POA: Insufficient documentation

## 2018-12-14 MED ORDER — LANSOPRAZOLE 15 MG PO CPDR: 15.0000 mg | DELAYED_RELEASE_CAPSULE | Freq: Two times a day (BID) | ORAL | 3 refills | Status: DC

## 2018-12-14 NOTE — Progress Notes (Signed)
Bedside U/S shows single IUP with FHT of 169 BPM.  CRL measrues 28.62 mm  GA is [redacted]w[redacted]d.  Pt underwent IVF @ [facility]

## 2018-12-14 NOTE — Progress Notes (Signed)
Subjective:    Rachael Dickerson is a G1P0000 [redacted]w[redacted]d being seen today for her first obstetrical visit.  Her obstetrical history is significant for advanced maternal age and history of adenoscarcinoma in situ and cervical conization and CHTN. Patient does intend to breast feed. Pregnancy history fully reviewed.  Patient reports heartburn and nausea.  Vitals:   12/14/18 1311  BP: (!) 141/83  Pulse: 96  Weight: 217 lb (98.4 kg)    HISTORY: OB History  Gravida Para Term Preterm AB Living  1 0 0 0 0 0  SAB TAB Ectopic Multiple Live Births  0 0 0 0 0    # Outcome Date GA Lbr Len/2nd Weight Sex Delivery Anes PTL Lv  1 Current            Past Medical History:  Diagnosis Date  . Adenocarcinoma in situ of cervix 11/22/2015   2012. Last pap was 11/16/2015 and cells were normal.   . Benign cyst of breast 11/22/2015  . Essential hypertension, benign 11/22/2015  . Insulin resistance 11/22/2015  . Obesity 11/22/2015  . Vaginal Pap smear, abnormal    Past Surgical History:  Procedure Laterality Date  . CERVICAL CONE BIOPSY  06/2010  . ivf    . TONSILLECTOMY     Family History  Problem Relation Age of Onset  . Hypertension Mother   . Cancer Maternal Grandmother        melanoma  . Breast cancer Other        Great MGM     Exam    Uterus:     Pelvic Exam:    Perineum: No Hemorrhoids   Vulva: normal   Vagina:  normal mucosa   pH: n/a   Cervix: closed firm and good length   Adnexa: no mass, fullness, tenderness   Bony Pelvis: average  System: Breast:  Inspection negative   Skin: normal coloration and turgor, no rashes    Neurologic: oriented, normal mood   Extremities: no deformities   HEENT sclera clear, anicteric, neck supple with midline trachea and trachea midline   Mouth/Teeth mucous membranes moist, pharynx normal without lesions and dental hygiene good   Neck supple and no masses   Cardiovascular: regular rate and rhythm   Respiratory:  appears well, vitals normal, no  respiratory distress, acyanotic, normal RR, ear and throat exam is normal, chest clear, no wheezing, crepitations, rhonchi, normal symmetric air entry   Abdomen: soft, non-tender; bowel sounds normal; no masses,  no organomegaly   Urinary: urethral meatus normal      Assessment:    Pregnancy: G1P0000 Patient Active Problem List   Diagnosis Date Noted  . Supervision of normal first pregnancy 12/14/2018  . [redacted] weeks gestation of pregnancy 12/02/2018  . Chronic hypertension 02/02/2018  . History of cervical cancer 02/02/2018  . Infertile female syndrome 02/02/2018  . Adenocarcinoma in situ of cervix 11/22/2015  . Benign cyst of breast 11/22/2015  . Essential hypertension, benign 11/22/2015  . Insulin resistance 11/22/2015  . Obesity 11/22/2015        Plan:  Initial labs drawn. Prenatal vitamins. Problem list reviewed and updated Genetic Screening discussed:  Pt had IVF with PGD; will need AFP at 18 week visit.     1.  History of cervical conization--will get TVUS at 16 weeks and see in the office at 14 and 18 weeks with cervical exams. 2.  Continue diclegis for nausea 3.  Pap smear at 1 year for AIS monitoring. 4.  HTN:  Home BP monitoring and baseline Pre E labs; antenatal testing.  Baby Asa at 13 weeks.    5.  Ultrasound discussed; fetal survey: at 18 weeks  Follow up in 4 weeks. 6.  Heartburn and nausea--continue diclegis and start prevacid.  Tums is not working.      Rachael LincolnKelly Blayklee Dickerson 12/14/2018

## 2018-12-14 NOTE — Progress Notes (Signed)
Bp recheck 132/84 P-88

## 2018-12-15 LAB — OBSTETRIC PANEL
Absolute Monocytes: 1367 cells/uL — ABNORMAL HIGH (ref 200–950)
Antibody Screen: NOT DETECTED
Basophils Absolute: 44 cells/uL (ref 0–200)
Basophils Relative: 0.3 %
Eosinophils Absolute: 441 cells/uL (ref 15–500)
Eosinophils Relative: 3 %
HCT: 36.2 % (ref 35.0–45.0)
Hemoglobin: 12.2 g/dL (ref 11.7–15.5)
Hepatitis B Surface Ag: NONREACTIVE
Lymphs Abs: 2822 cells/uL (ref 850–3900)
MCH: 31 pg (ref 27.0–33.0)
MCHC: 33.7 g/dL (ref 32.0–36.0)
MCV: 91.9 fL (ref 80.0–100.0)
MPV: 10.9 fL (ref 7.5–12.5)
Monocytes Relative: 9.3 %
Neutro Abs: 10025 cells/uL — ABNORMAL HIGH (ref 1500–7800)
Neutrophils Relative %: 68.2 %
Platelets: 440 10*3/uL — ABNORMAL HIGH (ref 140–400)
RBC: 3.94 10*6/uL (ref 3.80–5.10)
RDW: 12.3 % (ref 11.0–15.0)
RPR Ser Ql: NONREACTIVE
Rubella: 5.14 index
Total Lymphocyte: 19.2 %
WBC: 14.7 10*3/uL — ABNORMAL HIGH (ref 3.8–10.8)

## 2018-12-15 LAB — COMPREHENSIVE METABOLIC PANEL
AG Ratio: 1.6 (calc) (ref 1.0–2.5)
ALT: 18 U/L (ref 6–29)
AST: 14 U/L (ref 10–30)
Albumin: 3.9 g/dL (ref 3.6–5.1)
Alkaline phosphatase (APISO): 62 U/L (ref 31–125)
BUN: 8 mg/dL (ref 7–25)
CO2: 25 mmol/L (ref 20–32)
Calcium: 9 mg/dL (ref 8.6–10.2)
Chloride: 105 mmol/L (ref 98–110)
Creat: 0.71 mg/dL (ref 0.50–1.10)
Globulin: 2.5 g/dL (calc) (ref 1.9–3.7)
Glucose, Bld: 120 mg/dL — ABNORMAL HIGH (ref 65–99)
Potassium: 4.1 mmol/L (ref 3.5–5.3)
Sodium: 138 mmol/L (ref 135–146)
Total Bilirubin: 0.2 mg/dL (ref 0.2–1.2)
Total Protein: 6.4 g/dL (ref 6.1–8.1)

## 2018-12-15 LAB — PROTEIN / CREATININE RATIO, URINE
Creatinine, Urine: 191 mg/dL (ref 20–275)
Protein/Creat Ratio: 99 mg/g creat (ref 21–161)
Protein/Creatinine Ratio: 0.099 mg/mg creat (ref 0.021–0.16)
Total Protein, Urine: 19 mg/dL (ref 5–24)

## 2018-12-15 LAB — HEMOGLOBIN A1C
Hgb A1c MFr Bld: 5.6 % of total Hgb (ref <5.7)
Mean Plasma Glucose: 114 (calc)
eAG (mmol/L): 6.3 (calc)

## 2018-12-15 LAB — HIV ANTIBODY (ROUTINE TESTING W REFLEX): HIV 1&2 Ab, 4th Generation: NONREACTIVE

## 2018-12-15 LAB — TSH: TSH: 0.14 mIU/L — ABNORMAL LOW

## 2018-12-16 LAB — CERVICOVAGINAL ANCILLARY ONLY
Chlamydia: NEGATIVE
Neisseria Gonorrhea: NEGATIVE

## 2018-12-16 LAB — CULTURE, OB URINE

## 2018-12-16 LAB — URINE CULTURE, OB REFLEX

## 2018-12-23 ENCOUNTER — Telehealth: Payer: Self-pay | Admitting: *Deleted

## 2018-12-23 DIAGNOSIS — R7309 Other abnormal glucose: Secondary | ICD-10-CM

## 2018-12-23 DIAGNOSIS — R7989 Other specified abnormal findings of blood chemistry: Secondary | ICD-10-CM

## 2018-12-23 NOTE — Telephone Encounter (Signed)
Pt notified to schedule a early 2 hr GTT and Free T4.  Appt for 12/25/18

## 2018-12-23 NOTE — Telephone Encounter (Signed)
-----   Message from Guss Bunde, MD sent at 12/23/2018  3:27 PM EDT ----- Hgb A1c is borderline; please order a 2 hour glucola.  Also draw a free T4.

## 2018-12-25 ENCOUNTER — Other Ambulatory Visit: Payer: Self-pay

## 2018-12-25 ENCOUNTER — Other Ambulatory Visit (INDEPENDENT_AMBULATORY_CARE_PROVIDER_SITE_OTHER): Payer: BC Managed Care – PPO

## 2018-12-25 DIAGNOSIS — R7989 Other specified abnormal findings of blood chemistry: Secondary | ICD-10-CM | POA: Diagnosis not present

## 2018-12-25 DIAGNOSIS — Z3400 Encounter for supervision of normal first pregnancy, unspecified trimester: Secondary | ICD-10-CM | POA: Diagnosis not present

## 2018-12-25 DIAGNOSIS — R7309 Other abnormal glucose: Secondary | ICD-10-CM | POA: Diagnosis not present

## 2018-12-25 DIAGNOSIS — Z3689 Encounter for other specified antenatal screening: Secondary | ICD-10-CM | POA: Diagnosis not present

## 2018-12-25 NOTE — Progress Notes (Signed)
Pt here for BP check and sent to be sent to the lab for 2 hour GTT and T4, Free. BP 135/86. PT has no complaints.

## 2018-12-26 LAB — 2HR GTT W 1 HR, CARPENTER, 75 G
Glucose, 1 Hr, Gest: 200 mg/dL — ABNORMAL HIGH (ref 65–179)
Glucose, 2 Hr, Gest: 174 mg/dL — ABNORMAL HIGH (ref 65–152)
Glucose, Fasting, Gest: 90 mg/dL (ref 65–91)

## 2018-12-26 LAB — T4, FREE: Free T4: 1.2 ng/dL (ref 0.8–1.8)

## 2018-12-28 ENCOUNTER — Encounter: Payer: BC Managed Care – PPO | Admitting: Obstetrics & Gynecology

## 2018-12-28 ENCOUNTER — Other Ambulatory Visit: Payer: Self-pay

## 2018-12-28 ENCOUNTER — Other Ambulatory Visit: Payer: BC Managed Care – PPO

## 2018-12-28 ENCOUNTER — Telehealth: Payer: Self-pay

## 2018-12-28 DIAGNOSIS — O219 Vomiting of pregnancy, unspecified: Secondary | ICD-10-CM

## 2018-12-28 DIAGNOSIS — O24419 Gestational diabetes mellitus in pregnancy, unspecified control: Secondary | ICD-10-CM

## 2018-12-28 MED ORDER — PROMETHAZINE HCL 25 MG PO TABS: 25.0000 mg | ORAL_TABLET | Freq: Four times a day (QID) | ORAL | 1 refills | Status: DC | PRN

## 2018-12-28 NOTE — Telephone Encounter (Signed)
Pt called the office stating that she is taking Diclegis and Prevacid and neither is providing relief for nausea. Pt states that she is still having severe nausea. Pt also made aware that she has Gestational Diabetes. Referral for Diabetes education was ordered.  Rachael Dickerson l Rachael Dickerson, CMA

## 2018-12-29 ENCOUNTER — Encounter: Payer: Self-pay | Admitting: Obstetrics & Gynecology

## 2018-12-29 ENCOUNTER — Telehealth: Payer: Self-pay | Admitting: *Deleted

## 2018-12-29 DIAGNOSIS — O24419 Gestational diabetes mellitus in pregnancy, unspecified control: Secondary | ICD-10-CM | POA: Insufficient documentation

## 2018-12-29 MED ORDER — BLOOD GLUCOSE MONITOR KIT: PACK | 0 refills | Status: DC

## 2018-12-29 NOTE — Telephone Encounter (Signed)
Pt notified of abnormal GTt and order placed with N[facility]D center.  Glucometer and supplies sent to [facility].

## 2019-01-07 ENCOUNTER — Other Ambulatory Visit: Payer: BC Managed Care – PPO

## 2019-01-13 ENCOUNTER — Ambulatory Visit: Payer: BC Managed Care – PPO | Admitting: *Deleted

## 2019-01-13 ENCOUNTER — Encounter: Payer: BC Managed Care – PPO | Attending: Obstetrics & Gynecology | Admitting: *Deleted

## 2019-01-13 ENCOUNTER — Other Ambulatory Visit: Payer: Self-pay

## 2019-01-13 DIAGNOSIS — Z3A00 Weeks of gestation of pregnancy not specified: Secondary | ICD-10-CM | POA: Insufficient documentation

## 2019-01-13 DIAGNOSIS — O24419 Gestational diabetes mellitus in pregnancy, unspecified control: Secondary | ICD-10-CM | POA: Insufficient documentation

## 2019-01-13 NOTE — Progress Notes (Signed)
Patient was seen on 01/13/2019 for Impaired Glucose Tolerance at 14 weeks pregnancy self-management. EDD 07/14/2019. Patient states history of pre-diabetes a couple of years ago but more recent A1c's have been in the normal range after she increased her activity level and lost some weight. She states she has a nephew with type 1 diabetes.   Diet history obtained. Patient eats good variety of all food groups with occasional higher carbohydrate choices and beverages include mostly water.  She states no training on Carbohydrate Counting. Patient is currently on no diabetes medications.  The following learning objectives were met by the patient :   States the definition of Impaired Glucose Tolerance at 14 weeks and pregnancy   States why dietary management is important in controlling blood glucose  Describes the effects of carbohydrates on blood glucose levels  Demonstrates ability to create a balanced meal plan  Demonstrates carbohydrate counting   States when to check blood glucose levels  Demonstrates proper blood glucose monitoring techniques  States the effect of stress and exercise on blood glucose levels  States the importance of limiting caffeine and abstaining from alcohol and smoking  Rationale for medications including possibly insulin to help control BG during pregnancy  Knows of availability of Omnipod as insulin delivery device if insulin is needed  Plan:   Aim for 3 Carb Choices per meal (45 grams) +/- 1 either way   Aim for 1-2 Carbs per snack  Begin reading food labels for Total Carbohydrate of foods  Consider  increasing your activity level by walking or other activity daily as tolerated  Begin checking BG before breakfast and 2 hours after first bite of breakfast, lunch and dinner as directed by MD   Bring Log Book/Sheet and meter to every medical appointment OR use Baby Scripts (see below)  Did not get to cover Baby Scripts at this visit. Plan to reach out to her  and get her signed up tomorrow.   Take medication as directed by MD  IF it is determined that she needs insulin, I am available to see her at 5 PM on Thursday at the [place] office, 01/14/2019 to instruct her.  Patient already has a meter: Accu Chek Guide. But it was not covered by her [employer]. I suggested she request Rx for One Touch Verio Meter and supplies at her next MD visit as that appears to be covered.  She is testing pre breakfast and 2 hours each meal as directed by MD Review of Log Book shows: most FBG and post meal BG are elevated into the 110-180 range.   Patient instructed to monitor glucose levels: FBS: 60 - 95 mg/dl 2 hour: <120 mg/dl  Patient received the following handouts:  Nutrition Diabetes and Pregnancy  Carbohydrate Counting List  Patient will be seen for follow-up  as needed.

## 2019-01-14 ENCOUNTER — Telehealth: Payer: Self-pay | Admitting: *Deleted

## 2019-01-14 ENCOUNTER — Ambulatory Visit (INDEPENDENT_AMBULATORY_CARE_PROVIDER_SITE_OTHER): Payer: BC Managed Care – PPO | Admitting: Obstetrics & Gynecology

## 2019-01-14 VITALS — BP 140/89 | HR 91 | Wt 214.0 lb

## 2019-01-14 DIAGNOSIS — O24419 Gestational diabetes mellitus in pregnancy, unspecified control: Secondary | ICD-10-CM

## 2019-01-14 DIAGNOSIS — I10 Essential (primary) hypertension: Secondary | ICD-10-CM

## 2019-01-14 DIAGNOSIS — Z3400 Encounter for supervision of normal first pregnancy, unspecified trimester: Secondary | ICD-10-CM

## 2019-01-14 DIAGNOSIS — D069 Carcinoma in situ of cervix, unspecified: Secondary | ICD-10-CM

## 2019-01-14 DIAGNOSIS — O10012 Pre-existing essential hypertension complicating pregnancy, second trimester: Secondary | ICD-10-CM

## 2019-01-14 DIAGNOSIS — Z3A14 14 weeks gestation of pregnancy: Secondary | ICD-10-CM

## 2019-01-14 MED ORDER — GLUCOSE BLOOD VI STRP: ORAL_STRIP | 12 refills | Status: DC

## 2019-01-14 MED ORDER — BLOOD GLUCOSE MONITOR KIT: PACK | 0 refills | Status: DC

## 2019-01-14 MED ORDER — METFORMIN HCL 500 MG PO TABS: 500.0000 mg | ORAL_TABLET | Freq: Every day | ORAL | 5 refills | Status: DC

## 2019-01-14 MED ORDER — ACCU-CHEK FASTCLIX LANCETS MISC: 1.0000 | Freq: Four times a day (QID) | 12 refills | Status: DC

## 2019-01-14 NOTE — Telephone Encounter (Signed)
Patient replied via text she is already on Baby Scripts for weight and blood pressure documentation.  I have added her to Glucose Management now.

## 2019-01-14 NOTE — Progress Notes (Signed)
   PRENATAL VISIT NOTE  Subjective:  Rachael Dickerson is a 38 y.o. G1P0000 at [redacted]w[redacted]d being seen today for ongoing prenatal care.  She is currently monitored for the following issues for this high-risk pregnancy and has Adenocarcinoma in situ of cervix; Benign cyst of breast; Insulin resistance; Obesity; Chronic hypertension; History of cervical cancer; Infertile female syndrome; Supervision of normal first pregnancy; and Gestational diabetes mellitus (GDM) affecting pregnancy, antepartum on their problem list.  Patient reports no complaints. Pt is not happy that her souse is unable to come to her visits.  Contractions: Not present. Vag. Bleeding: None.  Movement: Absent. Denies leaking of fluid.   The following portions of the patient's history were reviewed and updated as appropriate: allergies, current medications, past family history, past medical history, past social history, past surgical history and problem list.   Objective:   Vitals:   01/14/19 1453  BP: (!) 151/90  Pulse: 91  Weight: 214 lb (97.1 kg)    Fetal Status: Fetal Heart Rate (bpm): 141   Movement: Absent     General:  Alert, oriented and cooperative. Patient is in no acute distress.  Skin: Skin is warm and dry. No rash noted.   Cardiovascular: Normal heart rate noted  Respiratory: Normal respiratory effort, no problems with respiration noted  Abdomen: Soft, gravid, appropriate for gestational age.  Pain/Pressure: Absent     Pelvic: Cervical exam deferred        Extremities: Normal range of motion.  Edema: Trace  Mental Status: Normal mood and affect. Normal behavior. Normal judgment and thought content.   Assessment and Plan:  Pregnancy: G1P0000 at [redacted]w[redacted]d 1. Supervision of normal first pregnancy, antepartum +FHR  2. Gestational diabetes mellitus (GDM) affecting pregnancy, antepartum Pt will likely need to start on Insulin this pregnancy. This was reviewed with her. Will attempt to start Metformin. >90% of her  fasting glucose were elevated.   Begin Metformin 500mg  qHS  3. Chronic hypertension Stable on Labetalol. 300mg  bid Initial BP elevated. Repeat BP WNL.  Pt has BP cuff at home and will check her BP weekly.   4. H/o CKC Need TV US to check cervical length within the week  Preterm labor symptoms and general obstetric precautions including but not limited to vaginal bleeding, contractions, leaking of fluid and fetal movement were reviewed in detail with the patient. Please refer to After Visit Summary for other counseling recommendations.   No follow-ups on file.  Future Appointments  Date Time Provider [place]  01/28/2019  4:00 PM [place] MFC-US  01/28/2019  4:00 PM [place] 3 WH-MFCUS MFC-US    Lavonia Drafts, MD

## 2019-01-14 NOTE — Progress Notes (Signed)
Pt denies headache or visual change Repeat BP 140/89

## 2019-01-15 ENCOUNTER — Ambulatory Visit (HOSPITAL_COMMUNITY)
Admission: RE | Admit: 2019-01-15 | Discharge: 2019-01-15 | Disposition: A | Discharge status: Home / Self Care | Payer: BC Managed Care – PPO | Source: Ambulatory Visit | Attending: Obstetrics and Gynecology | Admitting: Obstetrics and Gynecology

## 2019-01-15 ENCOUNTER — Other Ambulatory Visit: Payer: Self-pay

## 2019-01-15 ENCOUNTER — Encounter (HOSPITAL_COMMUNITY): Payer: Self-pay

## 2019-01-15 DIAGNOSIS — Z3400 Encounter for supervision of normal first pregnancy, unspecified trimester: Secondary | ICD-10-CM

## 2019-01-15 DIAGNOSIS — D069 Carcinoma in situ of cervix, unspecified: Secondary | ICD-10-CM

## 2019-01-15 DIAGNOSIS — I10 Essential (primary) hypertension: Secondary | ICD-10-CM

## 2019-01-15 DIAGNOSIS — O24419 Gestational diabetes mellitus in pregnancy, unspecified control: Secondary | ICD-10-CM

## 2019-01-18 ENCOUNTER — Ambulatory Visit (INDEPENDENT_AMBULATORY_CARE_PROVIDER_SITE_OTHER): Payer: BC Managed Care – PPO | Admitting: Obstetrics & Gynecology

## 2019-01-18 ENCOUNTER — Encounter: Payer: Self-pay | Admitting: *Deleted

## 2019-01-18 ENCOUNTER — Other Ambulatory Visit: Payer: Self-pay

## 2019-01-18 DIAGNOSIS — Z3400 Encounter for supervision of normal first pregnancy, unspecified trimester: Secondary | ICD-10-CM

## 2019-01-18 DIAGNOSIS — O24419 Gestational diabetes mellitus in pregnancy, unspecified control: Secondary | ICD-10-CM

## 2019-01-18 DIAGNOSIS — Z3A14 14 weeks gestation of pregnancy: Secondary | ICD-10-CM

## 2019-01-18 DIAGNOSIS — Z23 Encounter for immunization: Secondary | ICD-10-CM | POA: Diagnosis not present

## 2019-01-18 MED ORDER — METFORMIN HCL 500 MG PO TABS: 500.0000 mg | ORAL_TABLET | Freq: Two times a day (BID) | ORAL | 5 refills | Status: DC

## 2019-01-18 NOTE — Progress Notes (Signed)
   PRENATAL VISIT NOTE  Subjective:  Rachael Dickerson is a 38 y.o. G1P0000 at [redacted]w[redacted]d being seen today for ongoing prenatal care.  She is currently monitored for the following issues for this high-risk pregnancy and has Adenocarcinoma in situ of cervix; Benign cyst of breast; Insulin resistance; Obesity; Chronic hypertension; History of cervical cancer; Infertile female syndrome; Supervision of normal first pregnancy; and Gestational diabetes mellitus (GDM) affecting pregnancy, antepartum on their problem list.  Patient reports no complaints.  Contractions: Not present. Vag. Bleeding: None.  Movement: Absent. Denies leaking of fluid.   The following portions of the patient's history were reviewed and updated as appropriate: allergies, current medications, past family history, past medical history, past social history, past surgical history and problem list.   Objective:   Vitals:   01/18/19 1552  BP: 132/88  Pulse: 90  Weight: 212 lb (96.2 kg)    Fetal Status: Fetal Heart Rate (bpm): 153   Movement: Absent     General:  Alert, oriented and cooperative. Patient is in no acute distress.  Skin: Skin is warm and dry. No rash noted.   Cardiovascular: Normal heart rate noted  Respiratory: Normal respiratory effort, no problems with respiration noted  Abdomen: Soft, gravid, appropriate for gestational age.  Pain/Pressure: Absent     Pelvic: Cervical exam performed      closed/long/firm  Extremities: Normal range of motion.  Edema: Trace  Mental Status: Normal mood and affect. Normal behavior. Normal judgment and thought content.   Assessment and Plan:  Pregnancy: G1P0000 at [redacted]w[redacted]d 1. Gestational diabetes mellitus (GDM) affecting pregnancy, antepartum Fastings better with glucophage at night.  Will add glucophage in the a.m.   - metFORMIN (GLUCOPHAGE) 500 MG tablet; Take 1 tablet (500 mg total) by mouth 2 (two) times daily.  Dispense: 60 tablet; Refill: 5  2. CHTN Continue labetalol  Continue monitoring in BRx  3.  Hx CKC Cervix feels closed and long with tone Pt has TVUS rescheduled for this week Reveiwed signs of cervical incompetence  4.  AFP next visit  Preterm labor symptoms and general obstetric precautions including but not limited to vaginal bleeding, contractions, leaking of fluid and fetal movement were reviewed in detail with the patient. Please refer to After Visit Summary for other counseling recommendations.   RTC in one week  Future Appointments  Date Time Provider [place]  01/19/2019  9:10 AM [place] MFC-US  01/19/2019  9:15 AM [place] 4 WH-MFCUS MFC-US  01/25/2019  4:15 PM [place]egin[place] Beale, Fredderick Phenix, MD [facility]    Silas [place], MD

## 2019-01-19 ENCOUNTER — Ambulatory Visit (HOSPITAL_COMMUNITY): Payer: BC Managed Care – PPO | Admitting: *Deleted

## 2019-01-19 ENCOUNTER — Ambulatory Visit (HOSPITAL_COMMUNITY): Payer: BC Managed Care – PPO

## 2019-01-19 ENCOUNTER — Encounter (HOSPITAL_COMMUNITY): Payer: Self-pay

## 2019-01-19 ENCOUNTER — Ambulatory Visit (HOSPITAL_COMMUNITY)
Admission: RE | Admit: 2019-01-19 | Discharge: 2019-01-19 | Disposition: A | Discharge status: Home / Self Care | Payer: BC Managed Care – PPO | Source: Ambulatory Visit | Attending: Obstetrics & Gynecology | Admitting: Obstetrics & Gynecology

## 2019-01-19 ENCOUNTER — Other Ambulatory Visit (HOSPITAL_COMMUNITY): Payer: Self-pay | Admitting: *Deleted

## 2019-01-19 VITALS — BP 125/76 | HR 94 | Temp 98.6°F

## 2019-01-19 DIAGNOSIS — O0990 Supervision of high risk pregnancy, unspecified, unspecified trimester: Secondary | ICD-10-CM | POA: Diagnosis not present

## 2019-01-19 DIAGNOSIS — O24415 Gestational diabetes mellitus in pregnancy, controlled by oral hypoglycemic drugs: Secondary | ICD-10-CM | POA: Diagnosis not present

## 2019-01-19 DIAGNOSIS — O09812 Supervision of pregnancy resulting from assisted reproductive technology, second trimester: Secondary | ICD-10-CM

## 2019-01-19 DIAGNOSIS — Z3400 Encounter for supervision of normal first pregnancy, unspecified trimester: Secondary | ICD-10-CM | POA: Insufficient documentation

## 2019-01-19 DIAGNOSIS — O2692 Pregnancy related conditions, unspecified, second trimester: Secondary | ICD-10-CM | POA: Diagnosis not present

## 2019-01-19 DIAGNOSIS — O09512 Supervision of elderly primigravida, second trimester: Secondary | ICD-10-CM | POA: Diagnosis not present

## 2019-01-19 DIAGNOSIS — Z3686 Encounter for antenatal screening for cervical length: Secondary | ICD-10-CM

## 2019-01-19 DIAGNOSIS — O10012 Pre-existing essential hypertension complicating pregnancy, second trimester: Secondary | ICD-10-CM

## 2019-01-19 DIAGNOSIS — Z3A14 14 weeks gestation of pregnancy: Secondary | ICD-10-CM

## 2019-01-20 ENCOUNTER — Telehealth: Payer: Self-pay | Admitting: *Deleted

## 2019-01-20 MED ORDER — DOXYLAMINE-PYRIDOXINE 10-10 MG PO TBEC: 2.0000 | DELAYED_RELEASE_TABLET | Freq: Every day | ORAL | 6 refills | Status: DC

## 2019-01-20 MED ORDER — BLOOD GLUCOSE MONITOR KIT: PACK | 0 refills | Status: DC

## 2019-01-20 NOTE — Telephone Encounter (Signed)
Pt sent message needing a new RX for Diclegis and her insurance will only pay for One Touch or Freestyle glucose monitor and supplies not the Accu-Chek.  This was sent to [facility] in [place].

## 2019-01-25 ENCOUNTER — Encounter: Payer: Self-pay | Admitting: *Deleted

## 2019-01-25 ENCOUNTER — Other Ambulatory Visit: Payer: Self-pay

## 2019-01-25 ENCOUNTER — Ambulatory Visit (INDEPENDENT_AMBULATORY_CARE_PROVIDER_SITE_OTHER): Payer: BC Managed Care – PPO | Admitting: Obstetrics & Gynecology

## 2019-01-25 VITALS — BP 146/89 | HR 87 | Wt 212.0 lb

## 2019-01-25 DIAGNOSIS — Z3A15 15 weeks gestation of pregnancy: Secondary | ICD-10-CM

## 2019-01-25 DIAGNOSIS — O24419 Gestational diabetes mellitus in pregnancy, unspecified control: Secondary | ICD-10-CM

## 2019-01-25 DIAGNOSIS — O219 Vomiting of pregnancy, unspecified: Secondary | ICD-10-CM

## 2019-01-25 DIAGNOSIS — I10 Essential (primary) hypertension: Secondary | ICD-10-CM

## 2019-01-25 MED ORDER — ONDANSETRON HCL 4 MG PO TABS: 4.0000 mg | ORAL_TABLET | Freq: Three times a day (TID) | ORAL | 0 refills | Status: DC | PRN

## 2019-01-25 NOTE — Progress Notes (Signed)
   PRENATAL VISIT NOTE  Subjective:  Rachael Dickerson is a 38 y.o. G1P0000 at [redacted]w[redacted]d being seen today for ongoing prenatal care.  She is currently monitored for the following issues for this high-risk pregnancy and has Adenocarcinoma in situ of cervix; Benign cyst of breast; Insulin resistance; Obesity; Chronic hypertension; History of cervical cancer; Infertile female syndrome; Supervision of normal first pregnancy; and Gestational diabetes mellitus (GDM) affecting pregnancy, antepartum on their problem list.  Patient reports nausea and twinge of burning with urination.  Contractions: Not present. Vag. Bleeding: None.  Movement: Absent. Denies leaking of fluid.   The following portions of the patient's history were reviewed and updated as appropriate: allergies, current medications, past family history, past medical history, past social history, past surgical history and problem list.   Objective:   Vitals:   01/25/19 1605  BP: (!) 146/89  Pulse: 87  Weight: 212 lb (96.2 kg)    Fetal Status: Fetal Heart Rate (bpm): 151   Movement: Absent     General:  Alert, oriented and cooperative. Patient is in no acute distress.  Skin: Skin is warm and dry. No rash noted.   Cardiovascular: Normal heart rate noted  Respiratory: Normal respiratory effort, no problems with respiration noted  Abdomen: Soft, gravid, appropriate for gestational age.  Pain/Pressure: Absent     Pelvic: Cervical exam performed        Extremities: Normal range of motion.  Edema: None  Mental Status: Normal mood and affect. Normal behavior. Normal judgment and thought content.   Assessment and Plan:  Pregnancy: G1P0000 at [redacted]w[redacted]d 1.  GDM (nml hgb A1C at 9 weeks)--Continue metformin bid.  CBGs stable with a fe elevations. 2.  Cervix is closed and frim--Will recheck at anatomy exam. 3.  Nausea--add Zofran during the day 4.  Urinary frequency--UA clear; Pt make take AZO if symptomatic.  Will send culture. 5.  RTC 2-3 weeks.   Preterm labor symptoms and general obstetric precautions including but not limited to vaginal bleeding, contractions, leaking of fluid and fetal movement were reviewed in detail with the patient. Please refer to After Visit Summary for other counseling recommendations.   No follow-ups on file.  Future Appointments  Date Time Provider [place]  02/15/2019  3:45 PM G[place] Bunde, MD [facility]  02/18/2019  3:45 PM [place] NURSE [place] MFC-US  02/18/2019  3:45 PM WH-MFC [place] 2 WH-MFCUS MFC-US    Silas [place], MD

## 2019-01-28 ENCOUNTER — Ambulatory Visit (HOSPITAL_COMMUNITY): Payer: BC Managed Care – PPO

## 2019-02-01 ENCOUNTER — Ambulatory Visit (HOSPITAL_COMMUNITY): Payer: BC Managed Care – PPO

## 2019-02-11 ENCOUNTER — Encounter: Payer: BC Managed Care – PPO | Admitting: Obstetrics & Gynecology

## 2019-02-15 ENCOUNTER — Other Ambulatory Visit: Payer: Self-pay

## 2019-02-15 ENCOUNTER — Ambulatory Visit (INDEPENDENT_AMBULATORY_CARE_PROVIDER_SITE_OTHER): Payer: BC Managed Care – PPO | Admitting: Obstetrics & Gynecology

## 2019-02-15 VITALS — BP 128/72 | HR 89 | Wt 214.0 lb

## 2019-02-15 DIAGNOSIS — Z3A18 18 weeks gestation of pregnancy: Secondary | ICD-10-CM | POA: Diagnosis not present

## 2019-02-15 DIAGNOSIS — O24419 Gestational diabetes mellitus in pregnancy, unspecified control: Secondary | ICD-10-CM | POA: Diagnosis not present

## 2019-02-15 DIAGNOSIS — Z3402 Encounter for supervision of normal first pregnancy, second trimester: Secondary | ICD-10-CM

## 2019-02-16 LAB — ALPHA FETOPROTEIN, MATERNAL
AFP MoM: 0.57
AFP, Serum: 21.7 ng/mL
Calc'd Gestational Age: 18.7 weeks
Maternal Wt: 214 [lb_av]
Risk for ONTD: <1
Twins-AFP: 1

## 2019-02-16 MED ORDER — METFORMIN HCL 500 MG PO TABS: ORAL_TABLET | ORAL | 5 refills | Status: DC

## 2019-02-16 NOTE — Progress Notes (Signed)
   PRENATAL VISIT NOTE  Subjective:  Rachael Dickerson is a 38 y.o. G1P0000 at [redacted]w[redacted]d being seen today for ongoing prenatal care.  She is currently monitored for the following issues for this high-risk pregnancy and has Adenocarcinoma in situ of cervix; Benign cyst of breast; Insulin resistance; Obesity; Chronic hypertension; History of cervical cancer; Infertile female syndrome; Supervision of normal first pregnancy; and Gestational diabetes mellitus (GDM) affecting pregnancy, antepartum on their problem list.  Patient reports no complaints.  Contractions: Not present. Vag. Bleeding: None.  Movement: Absent. Denies leaking of fluid.   The following portions of the patient's history were reviewed and updated as appropriate: allergies, current medications, past family history, past medical history, past social history, past surgical history and problem list.   Objective:   Vitals:   02/15/19 1531  BP: 128/72  Pulse: 89  Weight: 214 lb (97.1 kg)    Fetal Status: Fetal Heart Rate (bpm): 147   Movement: Absent     General:  Alert, oriented and cooperative. Patient is in no acute distress.  Skin: Skin is warm and dry. No rash noted.   Cardiovascular: Normal heart rate noted  Respiratory: Normal respiratory effort, no problems with respiration noted  Abdomen: Soft, gravid, appropriate for gestational age.  Pain/Pressure: Present     Pelvic: Cervical exam deferred        Extremities: Normal range of motion.     Mental Status: Normal mood and affect. Normal behavior. Normal judgment and thought content.   Assessment and Plan:  Pregnancy: G1P0000 at [redacted]w[redacted]d 1. Encounter for supervision of normal first pregnancy in second trimester - Alpha fetoprotein, maternal - MFM [place] for cervical length and anatomy - Discussed DVT prevention on car trips  2. Gestational diabetes mellitus (GDM) affecting pregnancy, antepartum Having some postprandials in 120s and 130s.  Increase a.m. Glucophage to 750 mg.   Kepp 500 mg QHS.  - metFORMIN (GLUCOPHAGE) 500 MG tablet; Take 1.5 tablets in the morning and 1 tablet at night  Dispense: 60 tablet; Refill: 5  Preterm labor symptoms and general obstetric precautions including but not limited to vaginal bleeding, contractions, leaking of fluid and fetal movement were reviewed in detail with the patient. Please refer to After Visit Summary for other counseling recommendations.   Return in about 4 weeks (around 03/15/2019).  Future Appointments  Date Time Provider [place]  02/18/2019  3:45 PM [place] NURSE [place] MFC-US  02/18/2019  3:45 PM [place] 2 WH-MFCUS MFC-US  03/15/2019  4:00 PM [place] Trang, Fredderick Phenix, MD [facility]    Silas [place], MD

## 2019-02-18 ENCOUNTER — Ambulatory Visit (HOSPITAL_COMMUNITY)
Admission: RE | Admit: 2019-02-18 | Discharge: 2019-02-18 | Disposition: A | Discharge status: Home / Self Care | Payer: BC Managed Care – PPO | Source: Ambulatory Visit | Attending: Obstetrics and Gynecology | Admitting: Obstetrics and Gynecology

## 2019-02-18 ENCOUNTER — Ambulatory Visit (HOSPITAL_COMMUNITY): Payer: BC Managed Care – PPO | Admitting: *Deleted

## 2019-02-18 ENCOUNTER — Encounter (HOSPITAL_COMMUNITY): Payer: Self-pay

## 2019-02-18 ENCOUNTER — Other Ambulatory Visit: Payer: Self-pay

## 2019-02-18 DIAGNOSIS — O09512 Supervision of elderly primigravida, second trimester: Secondary | ICD-10-CM | POA: Diagnosis not present

## 2019-02-18 DIAGNOSIS — O09812 Supervision of pregnancy resulting from assisted reproductive technology, second trimester: Secondary | ICD-10-CM

## 2019-02-18 DIAGNOSIS — O24415 Gestational diabetes mellitus in pregnancy, controlled by oral hypoglycemic drugs: Secondary | ICD-10-CM

## 2019-02-18 DIAGNOSIS — O10012 Pre-existing essential hypertension complicating pregnancy, second trimester: Secondary | ICD-10-CM

## 2019-02-18 DIAGNOSIS — Z3400 Encounter for supervision of normal first pregnancy, unspecified trimester: Secondary | ICD-10-CM

## 2019-02-18 DIAGNOSIS — O2692 Pregnancy related conditions, unspecified, second trimester: Secondary | ICD-10-CM

## 2019-02-18 DIAGNOSIS — Z3A19 19 weeks gestation of pregnancy: Secondary | ICD-10-CM

## 2019-02-18 DIAGNOSIS — Z3686 Encounter for antenatal screening for cervical length: Secondary | ICD-10-CM | POA: Diagnosis not present

## 2019-02-19 ENCOUNTER — Other Ambulatory Visit (HOSPITAL_COMMUNITY): Payer: Self-pay | Admitting: *Deleted

## 2019-02-19 DIAGNOSIS — Z362 Encounter for other antenatal screening follow-up: Secondary | ICD-10-CM

## 2019-03-15 ENCOUNTER — Other Ambulatory Visit: Payer: Self-pay

## 2019-03-15 ENCOUNTER — Ambulatory Visit (INDEPENDENT_AMBULATORY_CARE_PROVIDER_SITE_OTHER): Payer: BC Managed Care – PPO | Admitting: Obstetrics & Gynecology

## 2019-03-15 VITALS — BP 131/80 | HR 93 | Temp 98.4°F | Wt 212.0 lb

## 2019-03-15 DIAGNOSIS — Z3A23 23 weeks gestation of pregnancy: Secondary | ICD-10-CM

## 2019-03-15 DIAGNOSIS — O24419 Gestational diabetes mellitus in pregnancy, unspecified control: Secondary | ICD-10-CM

## 2019-03-15 DIAGNOSIS — I10 Essential (primary) hypertension: Secondary | ICD-10-CM

## 2019-03-15 DIAGNOSIS — D069 Carcinoma in situ of cervix, unspecified: Secondary | ICD-10-CM

## 2019-03-18 ENCOUNTER — Encounter (HOSPITAL_COMMUNITY): Payer: Self-pay

## 2019-03-18 ENCOUNTER — Ambulatory Visit (HOSPITAL_COMMUNITY): Payer: BC Managed Care – PPO | Admitting: *Deleted

## 2019-03-18 ENCOUNTER — Other Ambulatory Visit: Payer: Self-pay

## 2019-03-18 ENCOUNTER — Ambulatory Visit (HOSPITAL_COMMUNITY)
Admission: RE | Admit: 2019-03-18 | Discharge: 2019-03-18 | Disposition: A | Discharge status: Home / Self Care | Payer: BC Managed Care – PPO | Source: Ambulatory Visit | Attending: Obstetrics and Gynecology | Admitting: Obstetrics and Gynecology

## 2019-03-18 DIAGNOSIS — Z3400 Encounter for supervision of normal first pregnancy, unspecified trimester: Secondary | ICD-10-CM

## 2019-03-18 DIAGNOSIS — O24415 Gestational diabetes mellitus in pregnancy, controlled by oral hypoglycemic drugs: Secondary | ICD-10-CM

## 2019-03-18 DIAGNOSIS — O10012 Pre-existing essential hypertension complicating pregnancy, second trimester: Secondary | ICD-10-CM

## 2019-03-18 DIAGNOSIS — O2692 Pregnancy related conditions, unspecified, second trimester: Secondary | ICD-10-CM

## 2019-03-18 DIAGNOSIS — O09512 Supervision of elderly primigravida, second trimester: Secondary | ICD-10-CM | POA: Diagnosis not present

## 2019-03-18 DIAGNOSIS — Z3A23 23 weeks gestation of pregnancy: Secondary | ICD-10-CM

## 2019-03-18 DIAGNOSIS — O09812 Supervision of pregnancy resulting from assisted reproductive technology, second trimester: Secondary | ICD-10-CM

## 2019-03-18 DIAGNOSIS — Z362 Encounter for other antenatal screening follow-up: Secondary | ICD-10-CM | POA: Insufficient documentation

## 2019-03-18 NOTE — Progress Notes (Signed)
   PRENATAL VISIT NOTE  Subjective:  Rachael Dickerson is a 38 y.o. G1P0000 at [redacted]w[redacted]d being seen today for ongoing prenatal care.  She is currently monitored for the following issues for this high-risk pregnancy and has Adenocarcinoma in situ of cervix; Benign cyst of breast; Insulin resistance; Obesity; Chronic hypertension; History of cervical cancer; Infertile female syndrome; Supervision of normal first pregnancy; and Gestational diabetes mellitus (GDM) affecting pregnancy, antepartum on their problem list.  Patient reports no complaints.  Contractions: Not present. Vag. Bleeding: None.  Movement: Present. Denies leaking of fluid.   The following portions of the patient's history were reviewed and updated as appropriate: allergies, current medications, past family history, past medical history, past social history, past surgical history and problem list.   Objective:   Vitals:   03/15/19 1545  BP: 131/80  Pulse: 93  Temp: 98.4 F (36.9 C)  Weight: 212 lb (96.2 kg)    Fetal Status: Fetal Heart Rate (bpm): 142   Movement: Present     General:  Alert, oriented and cooperative. Patient is in no acute distress.  Skin: Skin is warm and dry. No rash noted.   Cardiovascular: Normal heart rate noted  Respiratory: Normal respiratory effort, no problems with respiration noted  Abdomen: Soft, gravid, appropriate for gestational age.  Pain/Pressure: Absent     Pelvic: Cervical exam deferred        Extremities: Normal range of motion.  Edema: Trace  Mental Status: Normal mood and affect. Normal behavior. Normal judgment and thought content.   Assessment and Plan:  Pregnancy: G1P0000 at [redacted]w[redacted]d 1. Gestational diabetes mellitus (GDM) affecting pregnancy, antepartum Pt using babyscripts to record CBGs.  Well controlled on metformin.  Alldata flowing into portal  2. Adenocarcinoma in situ of cervix No cervical shortening at 20 weeks  3. Chronic hypertension BP stable at 130/80s.  Serial [place] for  growth.  Preterm labor symptoms and general obstetric precautions including but not limited to vaginal bleeding, contractions, leaking of fluid and fetal movement were reviewed in detail with the patient. Please refer to After Visit Summary for other counseling recommendations.   Return in about 3 years (around 03/14/2022) for Routine OB.  Future Appointments  Date Time Provider [place]  03/18/2019  3:45 PM [facility] NURSE [facility] MFC-US  03/18/2019  3:45 PM [place] 5 WH-MFCUS MFC-US  04/05/2019  3:45 PM [place] Enke, Fredderick Phenix, MD [facility]    Silas [place], MD

## 2019-03-22 ENCOUNTER — Other Ambulatory Visit (HOSPITAL_COMMUNITY): Payer: Self-pay | Admitting: *Deleted

## 2019-03-22 DIAGNOSIS — O10919 Unspecified pre-existing hypertension complicating pregnancy, unspecified trimester: Secondary | ICD-10-CM

## 2019-04-01 ENCOUNTER — Other Ambulatory Visit: Payer: Self-pay

## 2019-04-01 ENCOUNTER — Ambulatory Visit (INDEPENDENT_AMBULATORY_CARE_PROVIDER_SITE_OTHER): Payer: BC Managed Care – PPO | Admitting: Obstetrics and Gynecology

## 2019-04-01 VITALS — BP 131/79 | HR 90 | Temp 98.4°F | Wt 212.0 lb

## 2019-04-01 DIAGNOSIS — Z113 Encounter for screening for infections with a predominantly sexual mode of transmission: Secondary | ICD-10-CM | POA: Diagnosis not present

## 2019-04-01 DIAGNOSIS — Z3A25 25 weeks gestation of pregnancy: Secondary | ICD-10-CM

## 2019-04-01 DIAGNOSIS — O26892 Other specified pregnancy related conditions, second trimester: Secondary | ICD-10-CM | POA: Diagnosis not present

## 2019-04-01 DIAGNOSIS — N898 Other specified noninflammatory disorders of vagina: Secondary | ICD-10-CM

## 2019-04-01 DIAGNOSIS — Z3400 Encounter for supervision of normal first pregnancy, unspecified trimester: Secondary | ICD-10-CM | POA: Diagnosis not present

## 2019-04-01 LAB — FETAL FIBRONECTIN: Fetal Fibronectin: NEGATIVE

## 2019-04-01 NOTE — Progress Notes (Signed)
Pt states that she is having more watery D/C and is worried about leakage

## 2019-04-01 NOTE — Progress Notes (Signed)
ENCOUNTER NOTE  History:     Rachael Dickerson is a 38 y.o. G57P0000 female here @ 24w1dhere with complaints of increased discharge. She states over the last 2 days she has noticed wet underwear with more discharge than normal. No bleeding. No recent intercourse. Some mild cramping in her lower abdomen. The cramping feels like normal growing pains, however she wants to be sure it is not anything serious.  She has an OB appointment next week with Dr. LGala Romney   Obstetric History OB History  Gravida Para Term Preterm AB Living  1 0 0 0 0 0  SAB TAB Ectopic Multiple Live Births  0 0 0 0 0    # Outcome Date GA Lbr Len/2nd Weight Sex Delivery Anes PTL Lv  1 Current             Past Medical History:  Diagnosis Date  . Adenocarcinoma in situ of cervix 11/22/2015   2012. Last pap was 11/16/2015 and cells were normal.   . Benign cyst of breast 11/22/2015  . Essential hypertension, benign 11/22/2015  . Gestational diabetes   . Insulin resistance 11/22/2015  . Obesity 11/22/2015  . Vaginal Pap smear, abnormal     Past Surgical History:  Procedure Laterality Date  . CERVICAL CONE BIOPSY  06/2010  . ivf    . TONSILLECTOMY      Current Outpatient Medications on File Prior to Visit  Medication Sig Dispense Refill  . Accu-Chek FastClix Lancets MISC 1 Device by Percutaneous route 4 (four) times daily. 100 each 12  . aspirin EC 81 MG tablet Take 81 mg by mouth daily.    . blood glucose meter kit and supplies KIT Dispense based on patient and insurance preference. Use up to four times daily as directed. (FOR ICD-9 250.00, 250.01). 1 each 0  . glucose blood test strip Use as instructed 100 each 12  . labetalol (NORMODYNE) 300 MG tablet Take 1 tablet (300 mg total) by mouth 2 (two) times daily. 180 tablet 3  . lansoprazole (PREVACID) 15 MG capsule Take 1 capsule (15 mg total) by mouth 2 (two) times daily before a meal. 60 capsule 3  . metFORMIN (GLUCOPHAGE) 500 MG tablet Take 1.5 tablets in the morning  and 1 tablet at night 60 tablet 5  . ondansetron (ZOFRAN) 4 MG tablet Take 1 tablet (4 mg total) by mouth every 8 (eight) hours as needed for nausea or vomiting. 20 tablet 0  . Prenatal Vit-Fe Fumarate-FA (PRENATAL MULTIVITAMIN) TABS tablet Take 1 tablet by mouth daily at 12 noon.    . Doxylamine-Pyridoxine (DICLEGIS) 10-10 MG TBEC Take 2 tablets by mouth at bedtime. (Patient not taking: Reported on 02/18/2019) 60 tablet 6   No current facility-administered medications on file prior to visit.    Allergies  Allergen Reactions  . Erythromycin   . Latex     Social History:  reports that she has quit smoking. Her smoking use included cigarettes. She has a 20.00 pack-year smoking history. She has never used smokeless tobacco. She reports previous alcohol use. She reports that she does not use drugs.  Family History  Problem Relation Age of Onset  . Hypertension Mother   . Cancer Maternal Grandmother        melanoma  . Breast cancer Other        Great MGM    The following portions of the patient's history were reviewed and updated as appropriate: allergies, current medications, past family history, past medical  history, past social history, past surgical history and problem list.  Review of Systems Pertinent items noted in HPI and remainder of comprehensive ROS otherwise negative.  Physical Exam:  BP 131/79   Pulse 90   Temp 98.4 F (36.9 C)   Wt 212 lb (96.2 kg)   LMP 10/07/2018 (Approximate)   BMI 37.55 kg/m  CONSTITUTIONAL: Well-developed, well-nourished female in no acute distress.  HENT:  Normocephalic NEUROLOGIC: Alert and oriented to person, place, and time.  PSYCHIATRIC: Normal mood and affect. Normal behavior.  ABDOMEN: Soft, no distention noted.  No tenderness, rebound or guarding.  PELVIC: Normal appearing external genitalia and urethral meatus; normal appearing vaginal mucosa and cervix.  No abnormal discharge noted. No pooling of fluid. Cervix closed, thick,  posterior. FFN collected.   Assessment and Plan:   1. Vaginal discharge during pregnancy in second trimester  - Cervicovaginal ancillary only( [place]) - Fetal fibronectin - Fern negative - Negative pooling.  - Likely normal discharge in pregnancy.    Rachael Dickerson, Rachael Dickerson, Rachael Dickerson for [employer], [place]

## 2019-04-02 LAB — CERVICOVAGINAL ANCILLARY ONLY
Bacterial Vaginitis (gardnerella): NEGATIVE
Candida Glabrata: NEGATIVE
Candida Vaginitis: NEGATIVE
Chlamydia: NEGATIVE
Comment: NEGATIVE
Comment: NEGATIVE
Comment: NEGATIVE
Comment: NEGATIVE
Comment: NEGATIVE
Comment: NORMAL
Neisseria Gonorrhea: NEGATIVE
Trichomonas: NEGATIVE

## 2019-04-05 ENCOUNTER — Telehealth (INDEPENDENT_AMBULATORY_CARE_PROVIDER_SITE_OTHER): Payer: BC Managed Care – PPO | Admitting: Obstetrics & Gynecology

## 2019-04-05 ENCOUNTER — Encounter: Payer: Self-pay | Admitting: Obstetrics & Gynecology

## 2019-04-05 VITALS — BP 133/82 | Wt 209.0 lb

## 2019-04-05 DIAGNOSIS — O24419 Gestational diabetes mellitus in pregnancy, unspecified control: Secondary | ICD-10-CM

## 2019-04-05 DIAGNOSIS — Z3400 Encounter for supervision of normal first pregnancy, unspecified trimester: Secondary | ICD-10-CM

## 2019-04-05 DIAGNOSIS — Z3A25 25 weeks gestation of pregnancy: Secondary | ICD-10-CM

## 2019-04-05 DIAGNOSIS — I10 Essential (primary) hypertension: Secondary | ICD-10-CM

## 2019-04-05 NOTE — Progress Notes (Signed)
   PRENATAL VISIT NOTE  Subjective:  Rachael Dickerson is a 38 y.o. G1P0000 at [redacted]w[redacted]d being seen today for ongoing prenatal care.  She is currently monitored for the following issues for this high-risk pregnancy and has Adenocarcinoma in situ of cervix; Benign cyst of breast; Insulin resistance; Obesity; Chronic hypertension; History of cervical cancer; Infertile female syndrome; Supervision of normal first pregnancy; and Gestational diabetes mellitus (GDM) affecting pregnancy, antepartum on their problem list.  Patient reports no complaints.   .  .   . Denies leaking of fluid.   The following portions of the patient's history were reviewed and updated as appropriate: allergies, current medications, past family history, past medical history, past social history, past surgical history and problem list.   Objective:   Vitals:   04/05/19 1541  BP: 133/82  Weight: 209 lb (94.8 kg)    Fetal Status:           General:  Alert, oriented and cooperative. Patient is in no acute distress.  Skin: Skin is warm and dry. No rash noted.   Cardiovascular: Normal heart rate noted  Respiratory: Normal respiratory effort, no problems with respiration noted  Abdomen: Soft, gravid, appropriate for gestational age.        Pelvic: Cervical exam deferred        Extremities: Normal range of motion.     Mental Status: Normal mood and affect. Normal behavior. Normal judgment and thought content.   Assessment and Plan:  Pregnancy: G1P0000 at [redacted]w[redacted]d 1. Supervision of normal first pregnancy, antepartum Virtual visit on my chart  2. Gestational diabetes mellitus (GDM) affecting pregnancy, antepartum CBGs normal with a few elevations with french fries.  Cont meds.  Will need antenatal testing at 32 weeks.   3. Chronic hypertension BP is 130s/80s on Babyscripts.  Continue weekly BPs  Preterm labor symptoms and general obstetric precautions including but not limited to vaginal bleeding, contractions, leaking of  fluid and fetal movement were reviewed in detail with the patient. Please refer to After Visit Summary for other counseling recommendations.   No follow-ups on file.  Future Appointments  Date Time Provider [place]  04/13/2019  3:45 PM [place] NURSE [facility] MFC-US  04/13/2019  3:45 PM [place] 2 WH-MFCUS MFC-US    Silas [place], MD

## 2019-04-13 ENCOUNTER — Other Ambulatory Visit: Payer: Self-pay

## 2019-04-13 ENCOUNTER — Ambulatory Visit (INDEPENDENT_AMBULATORY_CARE_PROVIDER_SITE_OTHER): Payer: BC Managed Care – PPO | Admitting: Advanced Practice Midwife

## 2019-04-13 ENCOUNTER — Ambulatory Visit (HOSPITAL_COMMUNITY): Payer: BC Managed Care – PPO | Admitting: *Deleted

## 2019-04-13 ENCOUNTER — Encounter (HOSPITAL_COMMUNITY): Payer: Self-pay | Admitting: *Deleted

## 2019-04-13 ENCOUNTER — Ambulatory Visit (HOSPITAL_COMMUNITY)
Admission: RE | Admit: 2019-04-13 | Discharge: 2019-04-13 | Disposition: A | Discharge status: Home / Self Care | Payer: BC Managed Care – PPO | Source: Ambulatory Visit | Attending: Obstetrics and Gynecology | Admitting: Obstetrics and Gynecology

## 2019-04-13 VITALS — BP 129/85 | HR 100 | Temp 98.5°F | Wt 210.0 lb

## 2019-04-13 DIAGNOSIS — Z3400 Encounter for supervision of normal first pregnancy, unspecified trimester: Secondary | ICD-10-CM

## 2019-04-13 DIAGNOSIS — L739 Follicular disorder, unspecified: Secondary | ICD-10-CM

## 2019-04-13 DIAGNOSIS — O09512 Supervision of elderly primigravida, second trimester: Secondary | ICD-10-CM

## 2019-04-13 DIAGNOSIS — N739 Female pelvic inflammatory disease, unspecified: Secondary | ICD-10-CM

## 2019-04-13 DIAGNOSIS — O26892 Other specified pregnancy related conditions, second trimester: Secondary | ICD-10-CM

## 2019-04-13 DIAGNOSIS — O09812 Supervision of pregnancy resulting from assisted reproductive technology, second trimester: Secondary | ICD-10-CM

## 2019-04-13 DIAGNOSIS — O10012 Pre-existing essential hypertension complicating pregnancy, second trimester: Secondary | ICD-10-CM | POA: Diagnosis not present

## 2019-04-13 DIAGNOSIS — Z362 Encounter for other antenatal screening follow-up: Secondary | ICD-10-CM

## 2019-04-13 DIAGNOSIS — O2692 Pregnancy related conditions, unspecified, second trimester: Secondary | ICD-10-CM

## 2019-04-13 DIAGNOSIS — Z3A26 26 weeks gestation of pregnancy: Secondary | ICD-10-CM

## 2019-04-13 DIAGNOSIS — O24415 Gestational diabetes mellitus in pregnancy, controlled by oral hypoglycemic drugs: Secondary | ICD-10-CM

## 2019-04-13 DIAGNOSIS — O10919 Unspecified pre-existing hypertension complicating pregnancy, unspecified trimester: Secondary | ICD-10-CM | POA: Diagnosis not present

## 2019-04-13 MED ORDER — CEPHALEXIN 500 MG PO CAPS: 500.0000 mg | ORAL_CAPSULE | Freq: Three times a day (TID) | ORAL | 0 refills | Status: DC

## 2019-04-13 MED ORDER — LIDOCAINE 5 % EX OINT: 1.0000 "application " | TOPICAL_OINTMENT | CUTANEOUS | 0 refills | Status: DC | PRN

## 2019-04-13 NOTE — Progress Notes (Signed)
   PRENATAL VISIT NOTE  Subjective:  Rachael Dickerson is a 38 y.o. G1P0000 at [redacted]w[redacted]d being seen today for ongoing prenatal care.  She is currently monitored for the following issues for this low-risk pregnancy and has Adenocarcinoma in situ of cervix; Benign cyst of breast; Insulin resistance; Obesity; Chronic hypertension; History of cervical cancer; Infertile female syndrome; Supervision of normal first pregnancy; and Gestational diabetes mellitus (GDM) affecting pregnancy, antepartum on their problem list.  Patient reports painful cyst of left labia .  Contractions: Irritability. Vag. Bleeding: None.  Movement: Present. Denies leaking of fluid.   The following portions of the patient's history were reviewed and updated as appropriate: allergies, current medications, past family history, past medical history, past social history, past surgical history and problem list.   Objective:   Vitals:   04/13/19 0949  BP: 129/85  Pulse: 100  Temp: 98.5 F (36.9 C)  Weight: 210 lb (95.3 kg)    Fetal Status: Fetal Heart Rate (bpm): 148   Movement: Present     General:  Alert, oriented and cooperative. Patient is in no acute distress.  Skin: Skin is warm and dry. No rash noted.   Cardiovascular: Normal heart rate noted  Respiratory: Normal respiratory effort, no problems with respiration noted  Abdomen: Soft, gravid, appropriate for gestational age.  Pain/Pressure: Present     Pelvic: Cervical exam deferred        Extremities: Normal range of motion.  Edema: None  Mental Status: Normal mood and affect. Normal behavior. Normal judgment and thought content.   Assessment and Plan:  Pregnancy: G1P0000 at [redacted]w[redacted]d 1. Abscess of female genitalia --Smooth round raised area, ~ 2 cm x 2 cm in diameter nonfluctuant mass that is tender to palpation.  Area of folliculitis at surface in center of abscess, likely source of infection. --Continue warm compresses. Add abx x 1 week, see below.  Add lidocaine  ointment for topical pain relief. May use ice for pain relief, discussed use of "padciciles" with witch hazel and aloe vera gel.  - cephALEXin (KEFLEX) 500 MG capsule; Take 1 capsule (500 mg total) by mouth 3 (three) times daily.  Dispense: 21 capsule; Refill: 0  2. Folliculitis --Likely source of infection in area where pt shaves.   3. [redacted] weeks gestation of pregnancy --Doing well, no OB concerns/complaints today.  Preterm labor symptoms and general obstetric precautions including but not limited to vaginal bleeding, contractions, leaking of fluid and fetal movement were reviewed in detail with the patient. Please refer to After Visit Summary for other counseling recommendations.   No follow-ups on file.  Future Appointments  Date Time Provider [place]  04/13/2019  3:45 PM Welch NURSE [place] [facility]-US  04/13/2019  3:45 PM [place] 2 WH-MFCUS MFC-US  04/26/2019  3:15 PM Leggett, Fredderick Phenix, MD [facility]    Fatima Blank, CNM

## 2019-04-14 ENCOUNTER — Other Ambulatory Visit (HOSPITAL_COMMUNITY): Payer: Self-pay | Admitting: *Deleted

## 2019-04-14 DIAGNOSIS — O24415 Gestational diabetes mellitus in pregnancy, controlled by oral hypoglycemic drugs: Secondary | ICD-10-CM

## 2019-04-16 NOTE — L&D Delivery Note (Signed)
OB/GYN Faculty Practice Delivery Note  Rachael Dickerson is a 39 y.o. G1P0000 s/p VAVD at [redacted]w[redacted]d. She was admitted for IOL for cHTN and GDMA2.   ROM: 32h 57m with clear fluid GBS Status:  Negative/-- (03/04 0000) Maximum Maternal Temperature: 99.5 F  Labor Progress: . Patient presented to L&D for IOL for cHTN and GDMA2. Initial SVE: 4cm/40%/ballotable. Labor course was complicated by inaqdequate contractions after ~22 hours of pitocin requiring a pitocin break for ~4 hours and then maternal exhaustion after pushing for over two hours and reported maternal exhaustion. Ruptured for 32 hours at time of delivery. She then progressed to complete.   Indication for operative vaginal delivery: Minimal descent with pushing; maternal exhaustion.  Patient was examined and found to be fully dilated with fetal station of +2, caput +3.  Patient's bladder was noted to be empty, and there were no known fetal contraindications to operative vaginal delivery. EFW was 3100g by [place]/recent ultrasound.  FHR tracing remarkable for few variables but otherwise accels present with normal baseline and reactive.   Risks of vacuum assistance were discussed in detail, including but not limited to, bleeding, infection, damage to maternal tissues, fetal cephalohematoma, inability to effect vaginal delivery of the head or shoulder dystocia that cannot be resolved by established maneuvers and need for emergency cesarean section.  Patient gave verbal consent.    Delivery Date/Time: 07/02/2019 @ 1120 Delivery: Called to room and patient was complete and pushing. Due to maternal exhaustion and inadequate uterine contractions, VAVD was performed. Dr. Marice Potter present in room. The soft Kiwi vacuum cup was positioned over the sagittal suture 3 cm anterior to posterior fontanelle.  Pressure was then increased to 500 mmHg, and the patient was instructed to push.  Pulling was administered along the pelvic curve while patient was pushing;  there were ~ 8 contractions and 1 popoff.  Vacuum was reduced in between contractions. Episiotomy done by Dr. Marice Potter with local lidocaine injected prior and infant head delivered automatically in ROA position, shoulder and body delivered with ease over the perineum. Loose nuchal cord present and reduced after delivery. Infant with spontaneous cry, placed on mother's abdomen, dried and stimulated. Cord clamped x 2 after 1-minute delay, and cut by Dr. Morene Antu. Cord blood drawn. Placenta delivered spontaneously with gentle cord traction. Fundus firm with massage and pitocin started. Labia, perineum, vagina, and cervix inspected and significant for 2nd degree perineal laceration which was repaired with 3-0 Vicryl in a standard fashion. TXA and rectal Cytotec given due to brisk bleeding initially and high risk for PPH with protracted labor course on high doses of Pitocin.  Baby Weight: 2846 g  Cord: central insertion, 3 vessel Placenta: Sent to L&D Complications: Maternal exhaustion requiring VAVD and episiotomy, >24 ruptured  Lacerations: 2nd degree perineal laceration, and was repaired in the standard fashion EBL: 742cc Analgesia: Epidural   Infant: APGAR (1 MIN): 9   APGAR (5 MINS): 9   Aldean Jewett MD, PGY-1 OBGYN Faculty Teaching Service  07/02/2019, 11:46 AM  OB FELLOW DELIVERY ATTESTATION  I was gloved and present for the delivery in its entirety, and I agree with the above resident's note. Details about vacuum added by myself.    Jerilynn Birkenhead, MD [facility] Family [facility] Fellow, [facility] for [employer], [facility]

## 2019-04-26 ENCOUNTER — Telehealth (INDEPENDENT_AMBULATORY_CARE_PROVIDER_SITE_OTHER): Payer: BC Managed Care – PPO | Admitting: Obstetrics & Gynecology

## 2019-04-26 ENCOUNTER — Encounter: Payer: Self-pay | Admitting: Obstetrics & Gynecology

## 2019-04-26 VITALS — BP 138/78 | HR 100 | Wt 211.0 lb

## 2019-04-26 DIAGNOSIS — O24419 Gestational diabetes mellitus in pregnancy, unspecified control: Secondary | ICD-10-CM

## 2019-04-26 DIAGNOSIS — I10 Essential (primary) hypertension: Secondary | ICD-10-CM

## 2019-04-26 DIAGNOSIS — Z3400 Encounter for supervision of normal first pregnancy, unspecified trimester: Secondary | ICD-10-CM

## 2019-04-26 DIAGNOSIS — Z3A28 28 weeks gestation of pregnancy: Secondary | ICD-10-CM

## 2019-04-26 NOTE — Progress Notes (Signed)
   TELEHEALTH VIRTUAL OBSTETRICS VISIT ENCOUNTER NOTE  I connected with Rachael Dickerson on 04/26/19 at  3:15 PM EST by telephone at home and verified that I am speaking with the correct person using two identifiers.   I discussed the limitations, risks, security and privacy concerns of performing an evaluation and management service by telephone and the availability of in person appointments. I also discussed with the patient that there may be a patient responsible charge related to this service. The patient expressed understanding and agreed to proceed.  Subjective:  Rachael Dickerson is a 39 y.o. G1P0000 at [redacted]w[redacted]d being followed for ongoing prenatal care.  She is currently monitored for the following issues for this high-risk pregnancy and has Adenocarcinoma in situ of cervix; Benign cyst of breast; Insulin resistance; Obesity; Chronic hypertension; History of cervical cancer; Infertile female syndrome; Supervision of normal first pregnancy; and Gestational diabetes mellitus (GDM) affecting pregnancy, antepartum on their problem list.  Patient reports no complaints. Reports fetal movement. Denies any contractions, bleeding or leaking of fluid.   The following portions of the patient's history were reviewed and updated as appropriate: allergies, current medications, past family history, past medical history, past social history, past surgical history and problem list.   Objective:   General:  Alert, oriented and cooperative.   Mental Status: Normal mood and affect perceived. Normal judgment and thought content.  Rest of physical exam deferred due to type of encounter  Assessment and Plan:  Pregnancy: G1P0000 at [redacted]w[redacted]d 1. Supervision of high risk first pregnancy, antepartum CBGs are well controlled with metformin--rare elevated (can see on [facility]) BPs good on labetalol. BPs are transmitting to chart. Needs Tdap at next visit and CBC  Preterm labor symptoms and general obstetric  precautions including but not limited to vaginal bleeding, contractions, leaking of fluid and fetal movement were reviewed in detail with the patient.  I discussed the assessment and treatment plan with the patient. The patient was provided an opportunity to ask questions and all were answered. The patient agreed with the plan and demonstrated an understanding of the instructions. The patient was advised to call back or seek an in-person office evaluation/go to [facility] at [facility] for any urgent or concerning symptoms. Please refer to After Visit Summary for other counseling recommendations.   I provided 15 minutes of non-face-to-face time during this encounter.  RTC 2 weeks  Future Appointments  Date Time Provider Department Center  05/18/2019  3:45 PM WH-MFC [place] 2 WH-MFCUS MFC-US  05/18/2019  3:50 PM WH-MFC NURSE WH-MFC MFC-US    Rachael Lincoln, MD Center for [facility], [facility]

## 2019-05-07 ENCOUNTER — Encounter: Payer: Self-pay | Admitting: Obstetrics and Gynecology

## 2019-05-07 NOTE — Progress Notes (Signed)
Reviewed data from [employer], patient has not been logging CBGs 4x per day. Readings mostly within normal range. She was seen recently in office, per provider note, good control.   Baldemar Lenis, M.D. Attending Center for [employer] Midwife)

## 2019-05-10 ENCOUNTER — Ambulatory Visit (INDEPENDENT_AMBULATORY_CARE_PROVIDER_SITE_OTHER): Payer: BC Managed Care – PPO | Admitting: Obstetrics & Gynecology

## 2019-05-10 ENCOUNTER — Other Ambulatory Visit: Payer: Self-pay

## 2019-05-10 VITALS — BP 125/81 | HR 93 | Temp 98.2°F | Wt 215.0 lb

## 2019-05-10 DIAGNOSIS — I10 Essential (primary) hypertension: Secondary | ICD-10-CM

## 2019-05-10 DIAGNOSIS — Z23 Encounter for immunization: Secondary | ICD-10-CM | POA: Diagnosis not present

## 2019-05-10 DIAGNOSIS — Z3403 Encounter for supervision of normal first pregnancy, third trimester: Secondary | ICD-10-CM | POA: Diagnosis not present

## 2019-05-10 DIAGNOSIS — O10013 Pre-existing essential hypertension complicating pregnancy, third trimester: Secondary | ICD-10-CM | POA: Diagnosis not present

## 2019-05-10 DIAGNOSIS — Z3A30 30 weeks gestation of pregnancy: Secondary | ICD-10-CM | POA: Diagnosis not present

## 2019-05-10 DIAGNOSIS — O24419 Gestational diabetes mellitus in pregnancy, unspecified control: Secondary | ICD-10-CM | POA: Diagnosis not present

## 2019-05-10 NOTE — Progress Notes (Signed)
   PRENATAL VISIT NOTE  Subjective:  Rachael Dickerson is a 39 y.o. G1P0000 at [redacted]w[redacted]d being seen today for ongoing prenatal care.  She is currently monitored for the following issues for this high-risk pregnancy and has Adenocarcinoma in situ of cervix; Benign cyst of breast; Insulin resistance; Obesity; Chronic hypertension; History of cervical cancer; Infertile female syndrome; Supervision of normal first pregnancy; and Gestational diabetes mellitus (GDM) affecting pregnancy, antepartum on their problem list.  Patient reports fatigue.  Contractions: Not present. Vag. Bleeding: None.  Movement: Present. Denies leaking of fluid.   The following portions of the patient's history were reviewed and updated as appropriate: allergies, current medications, past family history, past medical history, past social history, past surgical history and problem list.   Objective:   Vitals:   05/10/19 1445  BP: 125/81  Pulse: 93  Temp: 98.2 F (36.8 C)  Weight: 215 lb (97.5 kg)    Fetal Status: Fetal Heart Rate (bpm): 143   Movement: Present     General:  Alert, oriented and cooperative. Patient is in no acute distress.  Skin: Skin is warm and dry. No rash noted.   Cardiovascular: Normal heart rate noted  Respiratory: Normal respiratory effort, no problems with respiration noted  Abdomen: Soft, gravid, appropriate for gestational age.  Pain/Pressure: Present     Pelvic: Cervical exam deferred        Extremities: Normal range of motion.  Edema: None  Mental Status: Normal mood and affect. Normal behavior. Normal judgment and thought content.   Assessment and Plan:  Pregnancy: G1P0000 at [redacted]w[redacted]d 1. Encounter for supervision of normal first pregnancy in third trimester - CBC - RPR - HIV antibody (with reflex)  2. Gestational diabetes mellitus (GDM) affecting pregnancy, antepartum One elevated lunch (ate chic ken sandwich.  Increase testing at dinner.  3. Chronic hypertension BPs well controlled.   Continue antenatal testing and serial growth [place].  Starts at 32 weeks  Preterm labor symptoms and general obstetric precautions including but not limited to vaginal bleeding, contractions, leaking of fluid and fetal movement were reviewed in detail with the patient. Please refer to After Visit Summary for other counseling recommendations.   Return in about 3 years (around 05/09/2022).  Future Appointments  Date Time Provider Department Center  05/18/2019  3:45 PM WH-MFC [place] 2 WH-MFCUS MFC-US  05/18/2019  3:50 PM WH-MFC NURSE WH-MFC MFC-US    Elsie Lincoln, MD

## 2019-05-11 LAB — CBC
HCT: 34.5 % — ABNORMAL LOW (ref 35.0–45.0)
Hemoglobin: 11.4 g/dL — ABNORMAL LOW (ref 11.7–15.5)
MCH: 28.6 pg (ref 27.0–33.0)
MCHC: 33 g/dL (ref 32.0–36.0)
MCV: 86.5 fL (ref 80.0–100.0)
MPV: 10.7 fL (ref 7.5–12.5)
Platelets: 406 10*3/uL — ABNORMAL HIGH (ref 140–400)
RBC: 3.99 10*6/uL (ref 3.80–5.10)
RDW: 13 % (ref 11.0–15.0)
WBC: 13.3 10*3/uL — ABNORMAL HIGH (ref 3.8–10.8)

## 2019-05-11 LAB — RPR: RPR Ser Ql: NONREACTIVE

## 2019-05-11 LAB — HIV ANTIBODY (ROUTINE TESTING W REFLEX): HIV 1&2 Ab, 4th Generation: NONREACTIVE

## 2019-05-18 ENCOUNTER — Encounter (HOSPITAL_COMMUNITY): Payer: Self-pay | Admitting: *Deleted

## 2019-05-18 ENCOUNTER — Ambulatory Visit (HOSPITAL_COMMUNITY)
Admission: RE | Admit: 2019-05-18 | Discharge: 2019-05-18 | Disposition: A | Discharge status: Home / Self Care | Payer: BC Managed Care – PPO | Source: Ambulatory Visit | Attending: Obstetrics and Gynecology | Admitting: Obstetrics and Gynecology

## 2019-05-18 ENCOUNTER — Other Ambulatory Visit: Payer: Self-pay

## 2019-05-18 ENCOUNTER — Ambulatory Visit (HOSPITAL_COMMUNITY): Payer: BC Managed Care – PPO | Admitting: *Deleted

## 2019-05-18 DIAGNOSIS — O10013 Pre-existing essential hypertension complicating pregnancy, third trimester: Secondary | ICD-10-CM

## 2019-05-18 DIAGNOSIS — Z3A31 31 weeks gestation of pregnancy: Secondary | ICD-10-CM | POA: Diagnosis not present

## 2019-05-18 DIAGNOSIS — O09513 Supervision of elderly primigravida, third trimester: Secondary | ICD-10-CM

## 2019-05-18 DIAGNOSIS — Z3400 Encounter for supervision of normal first pregnancy, unspecified trimester: Secondary | ICD-10-CM | POA: Insufficient documentation

## 2019-05-18 DIAGNOSIS — Z362 Encounter for other antenatal screening follow-up: Secondary | ICD-10-CM | POA: Diagnosis not present

## 2019-05-18 DIAGNOSIS — O24415 Gestational diabetes mellitus in pregnancy, controlled by oral hypoglycemic drugs: Secondary | ICD-10-CM | POA: Diagnosis not present

## 2019-05-19 ENCOUNTER — Other Ambulatory Visit (HOSPITAL_COMMUNITY): Payer: Self-pay | Admitting: *Deleted

## 2019-05-19 DIAGNOSIS — O24415 Gestational diabetes mellitus in pregnancy, controlled by oral hypoglycemic drugs: Secondary | ICD-10-CM

## 2019-05-24 ENCOUNTER — Ambulatory Visit (INDEPENDENT_AMBULATORY_CARE_PROVIDER_SITE_OTHER): Payer: BC Managed Care – PPO | Admitting: Obstetrics & Gynecology

## 2019-05-24 ENCOUNTER — Other Ambulatory Visit: Payer: Self-pay

## 2019-05-24 VITALS — BP 125/78 | HR 95 | Temp 98.4°F | Wt 216.0 lb

## 2019-05-24 DIAGNOSIS — O24419 Gestational diabetes mellitus in pregnancy, unspecified control: Secondary | ICD-10-CM

## 2019-05-24 DIAGNOSIS — O10013 Pre-existing essential hypertension complicating pregnancy, third trimester: Secondary | ICD-10-CM

## 2019-05-24 DIAGNOSIS — I10 Essential (primary) hypertension: Secondary | ICD-10-CM

## 2019-05-24 DIAGNOSIS — Z3403 Encounter for supervision of normal first pregnancy, third trimester: Secondary | ICD-10-CM

## 2019-05-24 DIAGNOSIS — Z3A32 32 weeks gestation of pregnancy: Secondary | ICD-10-CM

## 2019-05-24 NOTE — Progress Notes (Signed)
   PRENATAL VISIT NOTE  Subjective:  Rachael Dickerson is a 39 y.o. G1P0000 at [redacted]w[redacted]d being seen today for ongoing prenatal care.  She is currently monitored for the following issues for this high-risk pregnancy and has Adenocarcinoma in situ of cervix; Benign cyst of breast; Insulin resistance; Obesity; Chronic hypertension; History of cervical cancer; Infertile female syndrome; Supervision of normal first pregnancy; and Gestational diabetes mellitus (GDM) affecting pregnancy, antepartum on their problem list.  Patient reports no complaints.   .  .   . Denies leaking of fluid.   The following portions of the patient's history were reviewed and updated as appropriate: allergies, current medications, past family history, past medical history, past social history, past surgical history and problem list.   Objective:  There were no vitals filed for this visit.  Fetal Status:           General:  Alert, oriented and cooperative. Patient is in no acute distress.  Skin: Skin is warm and dry. No rash noted.   Cardiovascular: Normal heart rate noted  Respiratory: Normal respiratory effort, no problems with respiration noted  Abdomen: Soft, gravid, appropriate for gestational age.        Pelvic: Cervical exam deferred        Extremities: Normal range of motion.     Mental Status: Normal mood and affect. Normal behavior. Normal judgment and thought content.   Assessment and Plan:  Pregnancy: G1P0000 at [redacted]w[redacted]d 1. Chronic hypertension BP 130s/80s; continue labetalol Continue testing and growth [place]  2. Gestational diabetes mellitus (GDM) affecting pregnancy, antepartum Rare elevated, cont checking and GDM diet  3. Encounter for supervision of normal first pregnancy in third trimester 4 weeks for cultures  Preterm labor symptoms and general obstetric precautions including but not limited to vaginal bleeding, contractions, leaking of fluid and fetal movement were reviewed in detail with the  patient. Please refer to After Visit Summary for other counseling recommendations.   No follow-ups on file.  Future Appointments  Date Time Provider Department Center  05/26/2019  3:45 PM WH-MFC NURSE WH-MFC MFC-US  05/26/2019  3:45 PM WH-MFC [place] 2 WH-MFCUS MFC-US  06/02/2019  4:00 PM WH-MFC NURSE WH-MFC MFC-US  06/02/2019  4:00 PM WH-MFC [place] 3 WH-MFCUS MFC-US  06/09/2019  3:45 PM WH-MFC NURSE WH-MFC MFC-US  06/09/2019  3:45 PM WH-MFC [place] 2 WH-MFCUS MFC-US  06/16/2019  4:00 PM WH-MFC NURSE WH-MFC MFC-US  06/16/2019  4:00 PM WH-MFC [place] 3 WH-MFCUS MFC-US    Elsie Lincoln, MD

## 2019-05-26 ENCOUNTER — Ambulatory Visit (HOSPITAL_COMMUNITY): Payer: BC Managed Care – PPO | Admitting: *Deleted

## 2019-05-26 ENCOUNTER — Ambulatory Visit (HOSPITAL_COMMUNITY)
Admission: RE | Admit: 2019-05-26 | Discharge: 2019-05-26 | Disposition: A | Discharge status: Home / Self Care | Payer: BC Managed Care – PPO | Source: Ambulatory Visit | Attending: Obstetrics and Gynecology | Admitting: Obstetrics and Gynecology

## 2019-05-26 ENCOUNTER — Other Ambulatory Visit: Payer: Self-pay

## 2019-05-26 ENCOUNTER — Encounter (HOSPITAL_COMMUNITY): Payer: Self-pay

## 2019-05-26 DIAGNOSIS — O09513 Supervision of elderly primigravida, third trimester: Secondary | ICD-10-CM | POA: Diagnosis not present

## 2019-05-26 DIAGNOSIS — Z3403 Encounter for supervision of normal first pregnancy, third trimester: Secondary | ICD-10-CM | POA: Diagnosis not present

## 2019-05-26 DIAGNOSIS — Z3A33 33 weeks gestation of pregnancy: Secondary | ICD-10-CM | POA: Diagnosis not present

## 2019-05-26 DIAGNOSIS — O10013 Pre-existing essential hypertension complicating pregnancy, third trimester: Secondary | ICD-10-CM | POA: Diagnosis not present

## 2019-05-26 DIAGNOSIS — O24415 Gestational diabetes mellitus in pregnancy, controlled by oral hypoglycemic drugs: Secondary | ICD-10-CM | POA: Diagnosis not present

## 2019-05-27 ENCOUNTER — Other Ambulatory Visit (INDEPENDENT_AMBULATORY_CARE_PROVIDER_SITE_OTHER): Payer: BC Managed Care – PPO | Admitting: *Deleted

## 2019-05-27 DIAGNOSIS — O169 Unspecified maternal hypertension, unspecified trimester: Secondary | ICD-10-CM | POA: Diagnosis not present

## 2019-05-27 NOTE — Progress Notes (Signed)
Pt here for [facility] labs and BP check per Dr Penne Lash.  BP today is 138/83  P-96

## 2019-05-28 LAB — PROTEIN / CREATININE RATIO, URINE
Creatinine, Urine: 79 mg/dL (ref 20–275)
Protein/Creat Ratio: 127 mg/g creat (ref 21–161)
Protein/Creatinine Ratio: 0.127 mg/mg creat (ref 0.021–0.16)
Total Protein, Urine: 10 mg/dL (ref 5–24)

## 2019-05-28 LAB — CBC
HCT: 32.4 % — ABNORMAL LOW (ref 35.0–45.0)
Hemoglobin: 10.9 g/dL — ABNORMAL LOW (ref 11.7–15.5)
MCH: 29 pg (ref 27.0–33.0)
MCHC: 33.6 g/dL (ref 32.0–36.0)
MCV: 86.2 fL (ref 80.0–100.0)
MPV: 11.2 fL (ref 7.5–12.5)
Platelets: 383 10*3/uL (ref 140–400)
RBC: 3.76 10*6/uL — ABNORMAL LOW (ref 3.80–5.10)
RDW: 12.8 % (ref 11.0–15.0)
WBC: 12.8 10*3/uL — ABNORMAL HIGH (ref 3.8–10.8)

## 2019-05-28 LAB — HIV ANTIBODY (ROUTINE TESTING W REFLEX): HIV 1&2 Ab, 4th Generation: NONREACTIVE

## 2019-05-31 ENCOUNTER — Other Ambulatory Visit: Payer: Self-pay | Admitting: Obstetrics & Gynecology

## 2019-05-31 MED ORDER — LABETALOL HCL 200 MG PO TABS: 400.0000 mg | ORAL_TABLET | Freq: Two times a day (BID) | ORAL | 3 refills | Status: DC

## 2019-05-31 NOTE — Progress Notes (Signed)
Increasing BP meds to 400 mg bid.

## 2019-06-01 ENCOUNTER — Encounter: Payer: Self-pay | Admitting: *Deleted

## 2019-06-01 ENCOUNTER — Other Ambulatory Visit: Payer: Self-pay

## 2019-06-01 ENCOUNTER — Other Ambulatory Visit (INDEPENDENT_AMBULATORY_CARE_PROVIDER_SITE_OTHER): Payer: BC Managed Care – PPO | Admitting: *Deleted

## 2019-06-01 VITALS — BP 137/83 | HR 94

## 2019-06-01 DIAGNOSIS — O169 Unspecified maternal hypertension, unspecified trimester: Secondary | ICD-10-CM

## 2019-06-01 LAB — COMPREHENSIVE METABOLIC PANEL
AG Ratio: 1.5 (calc) (ref 1.0–2.5)
ALT: 15 U/L (ref 6–29)
AST: 13 U/L (ref 10–30)
Albumin: 3.7 g/dL (ref 3.6–5.1)
Alkaline phosphatase (APISO): 88 U/L (ref 31–125)
BUN: 7 mg/dL (ref 7–25)
CO2: 22 mmol/L (ref 20–32)
Calcium: 9.2 mg/dL (ref 8.6–10.2)
Chloride: 103 mmol/L (ref 98–110)
Creat: 0.51 mg/dL (ref 0.50–1.10)
Globulin: 2.4 g/dL (calc) (ref 1.9–3.7)
Glucose, Bld: 99 mg/dL (ref 65–99)
Potassium: 3.8 mmol/L (ref 3.5–5.3)
Sodium: 135 mmol/L (ref 135–146)
Total Bilirubin: 0.2 mg/dL (ref 0.2–1.2)
Total Protein: 6.1 g/dL (ref 6.1–8.1)

## 2019-06-01 NOTE — Progress Notes (Signed)
PIH labs drawn and BP in office is 137/83  P-94.  Pt states that her headache is gone.

## 2019-06-02 ENCOUNTER — Ambulatory Visit (HOSPITAL_COMMUNITY): Payer: BC Managed Care – PPO

## 2019-06-02 ENCOUNTER — Ambulatory Visit (HOSPITAL_COMMUNITY)
Admission: RE | Admit: 2019-06-02 | Discharge: 2019-06-02 | Disposition: A | Discharge status: Home / Self Care | Payer: BC Managed Care – PPO | Source: Ambulatory Visit | Attending: Obstetrics and Gynecology | Admitting: Obstetrics and Gynecology

## 2019-06-02 DIAGNOSIS — O24415 Gestational diabetes mellitus in pregnancy, controlled by oral hypoglycemic drugs: Secondary | ICD-10-CM | POA: Diagnosis not present

## 2019-06-02 DIAGNOSIS — Z3A34 34 weeks gestation of pregnancy: Secondary | ICD-10-CM

## 2019-06-02 LAB — PROTEIN / CREATININE RATIO, URINE
Creatinine, Urine: 155 mg/dL (ref 20–275)
Protein/Creat Ratio: 90 mg/g creat (ref 21–161)
Protein/Creatinine Ratio: 0.09 mg/mg creat (ref 0.021–0.16)
Total Protein, Urine: 14 mg/dL (ref 5–24)

## 2019-06-02 LAB — CBC
HCT: 32.6 % — ABNORMAL LOW (ref 35.0–45.0)
Hemoglobin: 10.7 g/dL — ABNORMAL LOW (ref 11.7–15.5)
MCH: 28.3 pg (ref 27.0–33.0)
MCHC: 32.8 g/dL (ref 32.0–36.0)
MCV: 86.2 fL (ref 80.0–100.0)
MPV: 10.5 fL (ref 7.5–12.5)
Platelets: 400 10*3/uL (ref 140–400)
RBC: 3.78 10*6/uL — ABNORMAL LOW (ref 3.80–5.10)
RDW: 12.9 % (ref 11.0–15.0)
WBC: 11.9 10*3/uL — ABNORMAL HIGH (ref 3.8–10.8)

## 2019-06-09 ENCOUNTER — Ambulatory Visit (HOSPITAL_COMMUNITY): Payer: BC Managed Care – PPO

## 2019-06-11 ENCOUNTER — Ambulatory Visit (HOSPITAL_COMMUNITY)
Admission: RE | Admit: 2019-06-11 | Discharge: 2019-06-11 | Disposition: A | Discharge status: Home / Self Care | Payer: BC Managed Care – PPO | Source: Ambulatory Visit | Attending: Maternal & Fetal Medicine | Admitting: Maternal & Fetal Medicine

## 2019-06-11 ENCOUNTER — Other Ambulatory Visit: Payer: Self-pay

## 2019-06-11 DIAGNOSIS — O10013 Pre-existing essential hypertension complicating pregnancy, third trimester: Secondary | ICD-10-CM | POA: Diagnosis not present

## 2019-06-11 DIAGNOSIS — O2693 Pregnancy related conditions, unspecified, third trimester: Secondary | ICD-10-CM | POA: Diagnosis not present

## 2019-06-11 DIAGNOSIS — O09513 Supervision of elderly primigravida, third trimester: Secondary | ICD-10-CM | POA: Diagnosis not present

## 2019-06-11 DIAGNOSIS — O24415 Gestational diabetes mellitus in pregnancy, controlled by oral hypoglycemic drugs: Secondary | ICD-10-CM | POA: Diagnosis not present

## 2019-06-11 DIAGNOSIS — O09813 Supervision of pregnancy resulting from assisted reproductive technology, third trimester: Secondary | ICD-10-CM

## 2019-06-11 DIAGNOSIS — Z3A35 35 weeks gestation of pregnancy: Secondary | ICD-10-CM

## 2019-06-14 ENCOUNTER — Other Ambulatory Visit: Payer: Self-pay

## 2019-06-14 ENCOUNTER — Other Ambulatory Visit (HOSPITAL_COMMUNITY): Payer: Self-pay | Admitting: *Deleted

## 2019-06-14 ENCOUNTER — Ambulatory Visit (INDEPENDENT_AMBULATORY_CARE_PROVIDER_SITE_OTHER): Payer: BC Managed Care – PPO | Admitting: Obstetrics & Gynecology

## 2019-06-14 VITALS — BP 133/78 | HR 98 | Temp 98.2°F | Wt 218.0 lb

## 2019-06-14 DIAGNOSIS — O0990 Supervision of high risk pregnancy, unspecified, unspecified trimester: Secondary | ICD-10-CM | POA: Diagnosis not present

## 2019-06-14 DIAGNOSIS — O0993 Supervision of high risk pregnancy, unspecified, third trimester: Secondary | ICD-10-CM

## 2019-06-14 DIAGNOSIS — Z113 Encounter for screening for infections with a predominantly sexual mode of transmission: Secondary | ICD-10-CM | POA: Diagnosis not present

## 2019-06-14 DIAGNOSIS — O10913 Unspecified pre-existing hypertension complicating pregnancy, third trimester: Secondary | ICD-10-CM

## 2019-06-14 DIAGNOSIS — O24419 Gestational diabetes mellitus in pregnancy, unspecified control: Secondary | ICD-10-CM

## 2019-06-14 DIAGNOSIS — Z3A35 35 weeks gestation of pregnancy: Secondary | ICD-10-CM

## 2019-06-14 NOTE — Progress Notes (Signed)
   PRENATAL VISIT NOTE  Subjective:  Rachael Dickerson is a 39 y.o. G1P0000 at [redacted]w[redacted]d being seen today for ongoing prenatal care.  She is currently monitored for the following issues for this high-risk pregnancy and has Adenocarcinoma in situ of cervix; Benign cyst of breast; Insulin resistance; Obesity; Chronic hypertension; History of cervical cancer; Infertile female syndrome; Supervision of normal first pregnancy; and Gestational diabetes mellitus (GDM) affecting pregnancy, antepartum on their problem list.  Patient reports no complaints.  Contractions: Irritability. Vag. Bleeding: None.  Movement: Present. Denies leaking of fluid.   The following portions of the patient's history were reviewed and updated as appropriate: allergies, current medications, past family history, past medical history, past social history, past surgical history and problem list.   Objective:   Vitals:   06/14/19 1452  BP: 133/78  Pulse: 98  Temp: 98.2 F (36.8 C)  Weight: 218 lb (98.9 kg)    Fetal Status: Fetal Heart Rate (bpm): 140   Movement: Present     General:  Alert, oriented and cooperative. Patient is in no acute distress.  Skin: Skin is warm and dry. No rash noted.   Cardiovascular: Normal heart rate noted  Respiratory: Normal respiratory effort, no problems with respiration noted  Abdomen: Soft, gravid, appropriate for gestational age.  Pain/Pressure: Absent     Pelvic: Cervical exam performed      external os FT/posterior  Extremities: Normal range of motion.  Edema: None  Mental Status: Normal mood and affect. Normal behavior. Normal judgment and thought content.   Assessment and Plan:  Pregnancy: G1P0000 at [redacted]w[redacted]d 1. Supervision of high risk pregnancy, antepartum - Culture, beta strep (group b only) - Cervicovaginal ancillary only( [place])  2. Gestational diabetes mellitus (GDM) affecting pregnancy, antepartum Rare post pradial mild elevation.  All fastings are WNL. Growth [place] and  antenatal testing is scheduled [place] at 38 weeks (foley bulb at 37 weeks 6 days with Shawnie Pons in [place].  3.  CHTN Stable BP, no evidence of pre E.  Preterm labor symptoms and general obstetric precautions including but not limited to vaginal bleeding, contractions, leaking of fluid and fetal movement were reviewed in detail with the patient. Please refer to After Visit Summary for other counseling recommendations.   Return in about 1 week (around 06/21/2019).  Future Appointments  Date Time Provider Department Center  06/16/2019  3:45 PM WH-MFC [place] 3 WH-MFCUS MFC-US  06/23/2019  3:45 PM WH-MFC [place] 3 WH-MFCUS MFC-US    Elsie Lincoln, MD

## 2019-06-15 LAB — CERVICOVAGINAL ANCILLARY ONLY
Chlamydia: NEGATIVE
Comment: NEGATIVE
Comment: NORMAL
Neisseria Gonorrhea: NEGATIVE

## 2019-06-16 ENCOUNTER — Ambulatory Visit (HOSPITAL_COMMUNITY): Payer: BC Managed Care – PPO

## 2019-06-16 ENCOUNTER — Other Ambulatory Visit: Payer: Self-pay

## 2019-06-16 ENCOUNTER — Ambulatory Visit (HOSPITAL_COMMUNITY)
Admission: RE | Admit: 2019-06-16 | Discharge: 2019-06-16 | Disposition: A | Discharge status: Home / Self Care | Payer: BC Managed Care – PPO | Source: Ambulatory Visit | Attending: Obstetrics and Gynecology | Admitting: Obstetrics and Gynecology

## 2019-06-16 DIAGNOSIS — Z362 Encounter for other antenatal screening follow-up: Secondary | ICD-10-CM | POA: Diagnosis not present

## 2019-06-16 DIAGNOSIS — O24415 Gestational diabetes mellitus in pregnancy, controlled by oral hypoglycemic drugs: Secondary | ICD-10-CM | POA: Insufficient documentation

## 2019-06-16 DIAGNOSIS — Z3A36 36 weeks gestation of pregnancy: Secondary | ICD-10-CM

## 2019-06-17 LAB — CULTURE, BETA STREP (GROUP B ONLY)
MICRO NUMBER:: 10200359
SPECIMEN QUALITY:: ADEQUATE

## 2019-06-17 LAB — OB RESULTS CONSOLE GBS: GBS: NEGATIVE

## 2019-06-18 ENCOUNTER — Telehealth (HOSPITAL_COMMUNITY): Payer: Self-pay | Admitting: *Deleted

## 2019-06-18 NOTE — Telephone Encounter (Signed)
Preadmission screen  

## 2019-06-21 ENCOUNTER — Encounter (HOSPITAL_COMMUNITY): Payer: Self-pay | Admitting: *Deleted

## 2019-06-21 ENCOUNTER — Telehealth (HOSPITAL_COMMUNITY): Payer: Self-pay | Admitting: *Deleted

## 2019-06-21 ENCOUNTER — Other Ambulatory Visit: Payer: Self-pay

## 2019-06-21 ENCOUNTER — Ambulatory Visit (INDEPENDENT_AMBULATORY_CARE_PROVIDER_SITE_OTHER): Payer: BC Managed Care – PPO | Admitting: Obstetrics & Gynecology

## 2019-06-21 VITALS — BP 130/78 | HR 97 | Temp 98.2°F | Wt 221.0 lb

## 2019-06-21 DIAGNOSIS — O24419 Gestational diabetes mellitus in pregnancy, unspecified control: Secondary | ICD-10-CM

## 2019-06-21 DIAGNOSIS — Z3A36 36 weeks gestation of pregnancy: Secondary | ICD-10-CM

## 2019-06-21 DIAGNOSIS — O0993 Supervision of high risk pregnancy, unspecified, third trimester: Secondary | ICD-10-CM

## 2019-06-21 DIAGNOSIS — O09513 Supervision of elderly primigravida, third trimester: Secondary | ICD-10-CM | POA: Insufficient documentation

## 2019-06-21 DIAGNOSIS — O10919 Unspecified pre-existing hypertension complicating pregnancy, unspecified trimester: Secondary | ICD-10-CM

## 2019-06-21 NOTE — Telephone Encounter (Signed)
Preadmission screen  

## 2019-06-21 NOTE — Progress Notes (Signed)
   PRENATAL VISIT NOTE  Subjective:  Rachael Dickerson is a 39 y.o. G1P0000 at [redacted]w[redacted]d being seen today for ongoing prenatal care.  She is currently monitored for the following issues for this high-risk pregnancy and has Benign cyst of breast; Insulin resistance; Obesity; Chronic hypertension complicating pregnancy, antepartum; History of cervical cancer (Adenocarcinoma in situ); Infertile female syndrome; Supervision of high-risk pregnancy; Gestational diabetes mellitus (GDM) affecting pregnancy, antepartum; and AMA (advanced maternal age) primigravida 41+, third trimester on their problem list.  Patient reports no complaints.  Contractions: Not present. Vag. Bleeding: None.  Movement: Present. Denies leaking of fluid.   The following portions of the patient's history were reviewed and updated as appropriate: allergies, current medications, past family history, past medical history, past social history, past surgical history and problem list.   Objective:   Vitals:   06/21/19 1522  BP: 130/78  Pulse: 97  Temp: 98.2 F (36.8 C)  Weight: 221 lb (100.2 kg)    Fetal Status: Fetal Heart Rate (bpm): 142   Movement: Present     General:  Alert, oriented and cooperative. Patient is in no acute distress.  Skin: Skin is warm and dry. No rash noted.   Cardiovascular: Normal heart rate noted  Respiratory: Normal respiratory effort, no problems with respiration noted  Abdomen: Soft, gravid, appropriate for gestational age.  Pain/Pressure: Present     Pelvic: Cervical exam deferred        Extremities: Normal range of motion.  Edema: None  Mental Status: Normal mood and affect. Normal behavior. Normal judgment and thought content.   Assessment and Plan:  Pregnancy: G1P0000 at [redacted]w[redacted]d 1. Gestational diabetes mellitus (GDM) affecting pregnancy, antepartum Only a couple of elevated postprandials in 120s-130s.  Continue oral medication control.   2. Chronic hypertension complicating pregnancy,  antepartum Stable BP on Labetalol. Continue weekly antenatal testing as per MFM. IOL in 38 weeks, scheduled.  3. AMA (advanced maternal age) primigravida 41+, third trimester See #2.   4. Supervision of high risk pregnancy in third trimester Will get outpatient foley next week. Cultures negative.  Preterm labor symptoms and general obstetric precautions including but not limited to vaginal bleeding, contractions, leaking of fluid and fetal movement were reviewed in detail with the patient. Please refer to After Visit Summary for other counseling recommendations.   No follow-ups on file.  Future Appointments  Date Time Provider Department Center  06/23/2019  3:45 PM WH-MFC [place] 3 WH-MFCUS MFC-US  06/28/2019  2:55 PM MC-SCREENING MC-SDSC None  06/29/2019  9:00 AM Reva Bores, MD [facility]  06/30/2019 12:00 AM MC-LD SCHED ROOM MC-INDC None    Jaynie Collins, MD

## 2019-06-23 ENCOUNTER — Other Ambulatory Visit: Payer: Self-pay | Admitting: Advanced Practice Midwife

## 2019-06-23 ENCOUNTER — Other Ambulatory Visit: Payer: Self-pay

## 2019-06-23 ENCOUNTER — Ambulatory Visit (HOSPITAL_COMMUNITY)
Admission: RE | Admit: 2019-06-23 | Discharge: 2019-06-23 | Disposition: A | Discharge status: Home / Self Care | Payer: BC Managed Care – PPO | Source: Ambulatory Visit | Attending: Maternal & Fetal Medicine | Admitting: Maternal & Fetal Medicine

## 2019-06-23 DIAGNOSIS — O09819 Supervision of pregnancy resulting from assisted reproductive technology, unspecified trimester: Secondary | ICD-10-CM

## 2019-06-23 DIAGNOSIS — O10913 Unspecified pre-existing hypertension complicating pregnancy, third trimester: Secondary | ICD-10-CM | POA: Diagnosis not present

## 2019-06-23 DIAGNOSIS — Z3A37 37 weeks gestation of pregnancy: Secondary | ICD-10-CM

## 2019-06-23 DIAGNOSIS — O24415 Gestational diabetes mellitus in pregnancy, controlled by oral hypoglycemic drugs: Secondary | ICD-10-CM

## 2019-06-23 DIAGNOSIS — O09513 Supervision of elderly primigravida, third trimester: Secondary | ICD-10-CM

## 2019-06-23 DIAGNOSIS — O2690 Pregnancy related conditions, unspecified, unspecified trimester: Secondary | ICD-10-CM

## 2019-06-24 ENCOUNTER — Telehealth (HOSPITAL_COMMUNITY): Payer: Self-pay | Admitting: *Deleted

## 2019-06-24 ENCOUNTER — Other Ambulatory Visit: Payer: Self-pay | Admitting: Obstetrics & Gynecology

## 2019-06-24 NOTE — Telephone Encounter (Signed)
Preadmission screen  

## 2019-06-28 ENCOUNTER — Other Ambulatory Visit (HOSPITAL_COMMUNITY)
Admission: RE | Admit: 2019-06-28 | Discharge: 2019-06-28 | Disposition: A | Discharge status: Home / Self Care | Payer: BC Managed Care – PPO | Source: Ambulatory Visit | Attending: Family Medicine | Admitting: Family Medicine

## 2019-06-28 ENCOUNTER — Other Ambulatory Visit (HOSPITAL_COMMUNITY): Payer: BC Managed Care – PPO

## 2019-06-28 DIAGNOSIS — Z20822 Contact with and (suspected) exposure to covid-19: Secondary | ICD-10-CM | POA: Insufficient documentation

## 2019-06-28 DIAGNOSIS — Z01812 Encounter for preprocedural laboratory examination: Secondary | ICD-10-CM | POA: Diagnosis not present

## 2019-06-28 LAB — SARS CORONAVIRUS 2 (TAT 6-24 HRS): SARS Coronavirus 2: NEGATIVE

## 2019-06-29 ENCOUNTER — Ambulatory Visit (INDEPENDENT_AMBULATORY_CARE_PROVIDER_SITE_OTHER): Payer: BC Managed Care – PPO | Admitting: Family Medicine

## 2019-06-29 ENCOUNTER — Encounter: Payer: Self-pay | Admitting: Family Medicine

## 2019-06-29 ENCOUNTER — Other Ambulatory Visit: Payer: Self-pay

## 2019-06-29 VITALS — BP 148/84 | HR 97 | Temp 98.3°F | Wt 219.0 lb

## 2019-06-29 DIAGNOSIS — N6009 Solitary cyst of unspecified breast: Secondary | ICD-10-CM

## 2019-06-29 DIAGNOSIS — O10919 Unspecified pre-existing hypertension complicating pregnancy, unspecified trimester: Secondary | ICD-10-CM

## 2019-06-29 DIAGNOSIS — O09513 Supervision of elderly primigravida, third trimester: Secondary | ICD-10-CM

## 2019-06-29 DIAGNOSIS — O0993 Supervision of high risk pregnancy, unspecified, third trimester: Secondary | ICD-10-CM | POA: Diagnosis not present

## 2019-06-29 DIAGNOSIS — O24419 Gestational diabetes mellitus in pregnancy, unspecified control: Secondary | ICD-10-CM | POA: Diagnosis not present

## 2019-06-29 DIAGNOSIS — O10913 Unspecified pre-existing hypertension complicating pregnancy, third trimester: Secondary | ICD-10-CM | POA: Diagnosis not present

## 2019-06-29 DIAGNOSIS — Z3A37 37 weeks gestation of pregnancy: Secondary | ICD-10-CM

## 2019-06-29 NOTE — Patient Instructions (Addendum)

## 2019-06-29 NOTE — H&P (Signed)
OBSTETRIC ADMISSION HISTORY AND PHYSICAL  Rachael Dickerson is a 39 y.o. female G1P0000 with IUP at 52w6dby IVF presenting for IOL for cHTN. She reports +FMs, No LOF, no VB, no blurry vision, headaches or peripheral edema, and RUQ pain.  She plans on formula feeding. She declines birth control. [facility]  Dating: By IVF --->  Estimated Date of Delivery: 07/14/19  Sono:    @[redacted]w[redacted]d , CWD, normal anatomy, cephalic presentation, 29166M 32% EFW  Prenatal History/Complications: Benign cyst of breast Chronic HTN Hx adenocarcinoma in situ (cervical cancer) GDMA2 AMA  Past Medical History: Past Medical History:  Diagnosis Date  . Adenocarcinoma in situ of cervix 11/22/2015   2012. Last pap was 11/16/2015 and cells were normal.   . Benign cyst of breast 11/22/2015  . Essential hypertension, benign 11/22/2015  . Gestational diabetes   . Insulin resistance 11/22/2015  . Obesity 11/22/2015  . Vaginal Pap smear, abnormal     Past Surgical History: Past Surgical History:  Procedure Laterality Date  . CERVICAL CONE BIOPSY  06/2010  . ivf    . TONSILLECTOMY      Obstetrical History: OB History    Gravida  1   Para  0   Term  0   Preterm  0   AB  0   Living  0     SAB  0   TAB  0   Ectopic  0   Multiple  0   Live Births  0           Social History Social History   Socioeconomic History  . Marital status: Married    Spouse name: Not on file  . Number of children: Not on file  . Years of education: Not on file  . Highest education level: Not on file  Occupational History  . Not on file  Tobacco Use  . Smoking status: Former Smoker    Packs/day: 1.00    Years: 20.00    Pack years: 20.00    Types: Cigarettes  . Smokeless tobacco: Never Used  Substance and Sexual Activity  . Alcohol use: Not Currently    Comment: socially  . Drug use: No  . Sexual activity: Yes    Birth control/protection: None  Other Topics Concern  . Not  on file  Social History Narrative  . Not on file   Social Determinants of Health   Financial Resource Strain:   . Difficulty of Paying Living Expenses:   Food Insecurity:   . Worried About RCharity fundraiserin the Last Year:   . RArboriculturistin the Last Year:   Transportation Needs:   . LFilm/video editor(Medical):   .Marland KitchenLack of Transportation (Non-Medical):   Physical Activity:   . Days of Exercise per Week:   . Minutes of Exercise per Session:   Stress:   . Feeling of Stress :   Social Connections:   . Frequency of Communication with Friends and Family:   . Frequency of Social Gatherings with Friends and Family:   . Attends Religious Services:   . Active Member of Clubs or Organizations:   . Attends CArchivistMeetings:   .Marland KitchenMarital Status:     Family History: Family History  Problem Relation Age of Onset  . Hypertension Mother   . Cancer Maternal Grandmother        melanoma  . Breast cancer Other  Great MGM    Allergies: Allergies  Allergen Reactions  . Erythromycin   . Latex     Medications Prior to Admission  Medication Sig Dispense Refill Last Dose  . aspirin EC 81 MG tablet Take 81 mg by mouth daily.   06/30/2019 at Unknown time  . labetalol (NORMODYNE) 200 MG tablet Take 2 tablets (400 mg total) by mouth 2 (two) times daily. 120 tablet 3 06/30/2019 at Unknown time  . lansoprazole (PREVACID) 15 MG capsule Take 1 capsule (15 mg total) by mouth 2 (two) times daily before a meal. 60 capsule 3 06/30/2019 at Unknown time  . metFORMIN (GLUCOPHAGE) 500 MG tablet Take 1.5 tablets in the morning and 1 tablet at night 60 tablet 5 06/30/2019 at Unknown time  . Prenatal Vit-Fe Fumarate-FA (PRENATAL MULTIVITAMIN) TABS tablet Take 1 tablet by mouth daily at 12 noon.   06/30/2019 at Unknown time  . Accu-Chek FastClix Lancets MISC 1 Device by Percutaneous route 4 (four) times daily. 100 each 12   . blood glucose meter kit and supplies KIT Dispense based  on patient and insurance preference. Use up to four times daily as directed. (FOR ICD-9 250.00, 250.01). 1 each 0   . glucose blood test strip Use as instructed 100 each 12      Review of Systems   All systems reviewed and negative except as stated in HPI  Height 5' 3"  (1.6 m), weight 101.4 kg, last menstrual period 10/07/2018. General appearance: alert, cooperative and appears stated age Lungs: normal effort Heart: regular rate  Abdomen: soft, non-tender Extremities: Homans sign is negative, no sign of DVT Presentation: cephalic Fetal monitoringBaseline: 125 bpm, Variability: Good {> 6 bpm) and Accelerations: Reactive Uterine activity: infrequent  Dilation: 4 Effacement (%): 40 Station: Ballotable Exam by:: dr Marice Potter    Prenatal labs: ABO, Rh: --/--/PENDING (03/17 0103) Antibody: PENDING (03/17 0103) Rubella: 5.14 (08/31 1417) RPR: NON-REACTIVE (01/25 1459)  HBsAg: NON-REACTIVE (08/31 1417)  HIV: NON-REACTIVE (02/11 1000)  GBS:   Neg 2 hr Glucola:  positive Genetic screening  AFP neg, had IVG with Preimplantation genetic diagnosis (PGD). Anatomy US Normal  Prenatal Transfer Tool  Maternal Diabetes: Yes:  Diabetes Type:  Insulin/Medication controlled Genetic Screening: Normal Maternal Ultrasounds/Referrals: Normal Fetal Ultrasounds or other Referrals:  None Maternal Substance Abuse:  No Significant Maternal Medications:  None Significant Maternal Lab Results: Group B Strep negative  Results for orders placed or performed during the hospital encounter of 06/30/19 (from the past 24 hour(s))  Type and screen   Collection Time: 06/30/19  1:03 AM  Result Value Ref Range   ABO/RH(D) PENDING    Antibody Screen PENDING    Sample Expiration      07/03/2019,2359 Performed at [facility], [place] [address], [place], [place] [address]   Glucose, capillary   Collection Time: 06/30/19  1:10 AM  Result Value Ref Range   Glucose-Capillary 100 (H) 70 - 99 mg/dL     Patient Active Problem List   Diagnosis Date Noted  . Chronic hypertension with exacerbation during pregnancy 06/30/2019  . AMA (advanced maternal age) primigravida 56+, third trimester 06/21/2019  . Gestational diabetes mellitus (GDM) affecting pregnancy, antepartum 12/29/2018  . Supervision of high-risk pregnancy 12/14/2018  . Chronic hypertension complicating pregnancy, antepartum 02/02/2018  . History of cervical cancer (Adenocarcinoma in situ) 02/02/2018  . Infertile female syndrome 02/02/2018  . Benign cyst of breast 11/22/2015  . Insulin resistance 11/22/2015  . Obesity 11/22/2015    Assessment/Plan:  Arushi Partridge is a 39 y.o. G1P0000 at 89w6dhere for IOL for cHTN.  #Labor: Vertex by exam and BSUS. FB had been placed in clinic and able to come out on admission. Will give Cytotec due to thickness. AROM/Pit as indicated. Anticipate SVD. #Pain: Per patient request #FWB: Cat I, EFW 3100g #ID: GBS negative #MOF: Formula #MOC:Declines #Circ:  Yes-inpatient (BCBS) #cHTN: Home meds of labetalol 4055mBID, will continue home labetalol. Current BP 147/99. #gDM on metformin: CBGs q4hr, q2hr when active.  RyLurline DelDO  06/30/2019, 12:39 AM  I saw and evaluated the patient. I agree with the findings and the plan of care as documented in the resident's note. Will check CMP and Pr/Cr. BP's elevated on admission but likely due to initial check-in and nervousness. Will CTM closely. Patient asymptomatic. Cont home Labetalol. Glucose 100 on admission.   ChBarrington EllisonMD [facility] Fellow, [facility] [place]

## 2019-06-29 NOTE — Progress Notes (Signed)
   PRENATAL VISIT NOTE  Subjective:  Rachael Dickerson is a 39 y.o. G1P0000 at [redacted]w[redacted]d being seen today for ongoing prenatal care.  She is currently monitored for the following issues for this high-risk pregnancy and has Benign cyst of breast; Insulin resistance; Obesity; Chronic hypertension complicating pregnancy, antepartum; History of cervical cancer (Adenocarcinoma in situ); Infertile female syndrome; Supervision of high-risk pregnancy; Gestational diabetes mellitus (GDM) affecting pregnancy, antepartum; and AMA (advanced maternal age) primigravida 49+, third trimester on their problem list.  Patient reports no complaints.  Contractions: Not present.  .  Movement: Present. Denies leaking of fluid.   The following portions of the patient's history were reviewed and updated as appropriate: allergies, current medications, past family history, past medical history, past social history, past surgical history and problem list. Problem list updated.  Objective:   Vitals:   06/29/19 0849  BP: (!) 148/84  Pulse: 97  Temp: 98.3 F (36.8 C)  Weight: 219 lb (99.3 kg)    Fetal Status: Fetal Heart Rate (bpm): NST   Movement: Present  Presentation: Vertex  General:  Alert, oriented and cooperative. Patient is in no acute distress.  Skin: Skin is warm and dry. No rash noted.   Cardiovascular: Normal heart rate noted  Respiratory: Normal respiratory effort, no problems with respiration noted  Abdomen: Soft, gravid, appropriate for gestational age.  Pain/Pressure: Absent     Pelvic: Cervical exam deferred        Extremities: Normal range of motion.     Mental Status:  Normal mood and affect. Normal behavior. Normal judgment and thought content.  Procedure: Patient informed of R/B/A of procedure. NST was performed and was reactive prior to procedure. NST:  EFM: Baseline: 130 bpm, Variability: Good {> 6 bpm), Accelerations: Reactive and Decelerations: Absent Toco: none Procedure done to begin  ripening of the cervix prior to admission for induction of labor. Appropriate time out taken. The patient was placed in the lithotomy position and the cervix brought into view with sterile speculum. A ring forcep was used to guide the 63F foley balloon through the internal os of the cervix. Foley Balloon filled with 60cc of normal saline. Plug inserted into end of the foley. Foley placed on tension and taped to medial thigh.  NST:  EFM Baseline: 130 bpm, Variability: Good {> 6 bpm), Accelerations: Reactive and Decelerations: Absent  Toco: none There were no signs of tachysystole or hypertonus. All equipment was removed and accounted for. The patient tolerated the procedure well.  Assessment and Plan:  Pregnancy: G1P0000 at [redacted]w[redacted]d 1. AMA (advanced maternal age) primigravida 55+, third trimester Normal genetics prior to implantation  2. Supervision of high risk pregnancy in third trimester S/p foley  3. Chronic hypertension complicating pregnancy, antepartum BP ok today, for IOL tomorrow. Consider labs on admission if BP is up. Continue ASA  4. Gestational diabetes mellitus (GDM) affecting pregnancy, antepartum See babyscripts data--continue Metformin. All CBGs in range.   S/p Outpatient placement of foley balloon catheter for cervical ripening. Induction of labor scheduled for tomorrow at 1200 am. Reassuring FHR tracing with no concerns at present. Warning signs given to patient to include return to MAU for heavy vaginal bleeding, Rupture of membranes, painful uterine contractions q 5 mins or less, severe abdominal discomfort, decreased fetal movement.  Return in 6 weeks (on 08/10/2019).   Reva Bores, MD 06/29/2019 9:32 AM

## 2019-06-30 ENCOUNTER — Inpatient Hospital Stay (HOSPITAL_COMMUNITY)
Admission: AD | Admit: 2019-06-30 | Discharge: 2019-07-04 | DRG: 807 | Disposition: A | Discharge status: Home / Self Care | Payer: BC Managed Care – PPO | Attending: Obstetrics & Gynecology | Admitting: Obstetrics & Gynecology

## 2019-06-30 ENCOUNTER — Encounter (HOSPITAL_COMMUNITY): Payer: Self-pay | Admitting: Obstetrics & Gynecology

## 2019-06-30 ENCOUNTER — Inpatient Hospital Stay (HOSPITAL_COMMUNITY): Payer: BC Managed Care – PPO

## 2019-06-30 ENCOUNTER — Inpatient Hospital Stay (HOSPITAL_COMMUNITY): Payer: BC Managed Care – PPO | Admitting: Anesthesiology

## 2019-06-30 ENCOUNTER — Other Ambulatory Visit: Payer: Self-pay

## 2019-06-30 DIAGNOSIS — Z3A38 38 weeks gestation of pregnancy: Secondary | ICD-10-CM | POA: Diagnosis not present

## 2019-06-30 DIAGNOSIS — O09513 Supervision of elderly primigravida, third trimester: Secondary | ICD-10-CM | POA: Diagnosis not present

## 2019-06-30 DIAGNOSIS — E669 Obesity, unspecified: Secondary | ICD-10-CM | POA: Diagnosis present

## 2019-06-30 DIAGNOSIS — O10919 Unspecified pre-existing hypertension complicating pregnancy, unspecified trimester: Secondary | ICD-10-CM | POA: Diagnosis present

## 2019-06-30 DIAGNOSIS — O0990 Supervision of high risk pregnancy, unspecified, unspecified trimester: Secondary | ICD-10-CM

## 2019-06-30 DIAGNOSIS — Z87891 Personal history of nicotine dependence: Secondary | ICD-10-CM

## 2019-06-30 DIAGNOSIS — O24425 Gestational diabetes mellitus in childbirth, controlled by oral hypoglycemic drugs: Secondary | ICD-10-CM | POA: Diagnosis present

## 2019-06-30 DIAGNOSIS — O114 Pre-existing hypertension with pre-eclampsia, complicating childbirth: Secondary | ICD-10-CM | POA: Diagnosis not present

## 2019-06-30 DIAGNOSIS — O0993 Supervision of high risk pregnancy, unspecified, third trimester: Secondary | ICD-10-CM

## 2019-06-30 DIAGNOSIS — Z3A37 37 weeks gestation of pregnancy: Secondary | ICD-10-CM

## 2019-06-30 DIAGNOSIS — O1002 Pre-existing essential hypertension complicating childbirth: Secondary | ICD-10-CM | POA: Diagnosis not present

## 2019-06-30 DIAGNOSIS — O24419 Gestational diabetes mellitus in pregnancy, unspecified control: Secondary | ICD-10-CM | POA: Diagnosis present

## 2019-06-30 DIAGNOSIS — O24420 Gestational diabetes mellitus in childbirth, diet controlled: Secondary | ICD-10-CM | POA: Diagnosis not present

## 2019-06-30 DIAGNOSIS — Z8541 Personal history of malignant neoplasm of cervix uteri: Secondary | ICD-10-CM

## 2019-06-30 DIAGNOSIS — O99214 Obesity complicating childbirth: Secondary | ICD-10-CM | POA: Diagnosis not present

## 2019-06-30 DIAGNOSIS — O1410 Severe pre-eclampsia, unspecified trimester: Secondary | ICD-10-CM

## 2019-06-30 DIAGNOSIS — O24429 Gestational diabetes mellitus in childbirth, unspecified control: Secondary | ICD-10-CM | POA: Diagnosis not present

## 2019-06-30 DIAGNOSIS — Z8759 Personal history of other complications of pregnancy, childbirth and the puerperium: Secondary | ICD-10-CM

## 2019-06-30 LAB — URINALYSIS, ROUTINE W REFLEX MICROSCOPIC
Bilirubin Urine: NEGATIVE
Glucose, UA: NEGATIVE mg/dL
Hgb urine dipstick: NEGATIVE
Ketones, ur: 5 mg/dL — AB
Leukocytes,Ua: NEGATIVE
Nitrite: NEGATIVE
Protein, ur: 100 mg/dL — AB
Specific Gravity, Urine: 1.025 (ref 1.005–1.030)
pH: 6 (ref 5.0–8.0)

## 2019-06-30 LAB — CBC
HCT: 33 % — ABNORMAL LOW (ref 36.0–46.0)
HCT: 32.1 % — ABNORMAL LOW (ref 36.0–46.0)
Hemoglobin: 10.8 g/dL — ABNORMAL LOW (ref 12.0–15.0)
Hemoglobin: 10.4 g/dL — ABNORMAL LOW (ref 12.0–15.0)
MCH: 27.9 pg (ref 26.0–34.0)
MCH: 27.6 pg (ref 26.0–34.0)
MCHC: 32.7 g/dL (ref 30.0–36.0)
MCHC: 32.4 g/dL (ref 30.0–36.0)
MCV: 85.3 fL (ref 80.0–100.0)
MCV: 85.1 fL (ref 80.0–100.0)
Platelets: 376 10*3/uL (ref 150–400)
Platelets: 365 10*3/uL (ref 150–400)
RBC: 3.87 MIL/uL (ref 3.87–5.11)
RBC: 3.77 MIL/uL — ABNORMAL LOW (ref 3.87–5.11)
RDW: 13.4 % (ref 11.5–15.5)
RDW: 13.3 % (ref 11.5–15.5)
WBC: 16 10*3/uL — ABNORMAL HIGH (ref 4.0–10.5)
WBC: 15.4 10*3/uL — ABNORMAL HIGH (ref 4.0–10.5)
nRBC: 0 % (ref 0.0–0.2)
nRBC: 0 % (ref 0.0–0.2)

## 2019-06-30 LAB — ABO/RH: ABO/RH(D): O POS

## 2019-06-30 LAB — COMPREHENSIVE METABOLIC PANEL
ALT: 18 U/L (ref 0–44)
AST: 20 U/L (ref 15–41)
Albumin: 3 g/dL — ABNORMAL LOW (ref 3.5–5.0)
Alkaline Phosphatase: 108 U/L (ref 38–126)
Anion gap: 14 (ref 5–15)
BUN: 6 mg/dL (ref 6–20)
CO2: 19 mmol/L — ABNORMAL LOW (ref 22–32)
Calcium: 9.2 mg/dL (ref 8.9–10.3)
Chloride: 104 mmol/L (ref 98–111)
Creatinine, Ser: 0.62 mg/dL (ref 0.44–1.00)
GFR calc Af Amer: >60 mL/min (ref >60)
GFR calc non Af Amer: >60 mL/min (ref >60)
Glucose, Bld: 103 mg/dL — ABNORMAL HIGH (ref 70–99)
Potassium: 4.2 mmol/L (ref 3.5–5.1)
Sodium: 137 mmol/L (ref 135–145)
Total Bilirubin: 0.2 mg/dL — ABNORMAL LOW (ref 0.3–1.2)
Total Protein: 6.3 g/dL — ABNORMAL LOW (ref 6.5–8.1)

## 2019-06-30 LAB — GLUCOSE, CAPILLARY
Glucose-Capillary: 100 mg/dL — ABNORMAL HIGH (ref 70–99)
Glucose-Capillary: 88 mg/dL (ref 70–99)
Glucose-Capillary: 73 mg/dL (ref 70–99)
Glucose-Capillary: 80 mg/dL (ref 70–99)
Glucose-Capillary: 64 mg/dL — ABNORMAL LOW (ref 70–99)
Glucose-Capillary: 97 mg/dL (ref 70–99)
Glucose-Capillary: 72 mg/dL (ref 70–99)

## 2019-06-30 LAB — TYPE AND SCREEN
ABO/RH(D): O POS
Antibody Screen: NEGATIVE

## 2019-06-30 LAB — PROTEIN / CREATININE RATIO, URINE
Creatinine, Urine: 207.74 mg/dL
Protein Creatinine Ratio: 0.13 mg/mg{Cre} (ref 0.00–0.15)
Total Protein, Urine: 26 mg/dL

## 2019-06-30 LAB — RPR: RPR Ser Ql: NONREACTIVE

## 2019-06-30 MED ORDER — ACETAMINOPHEN 325 MG PO TABS: 650.0000 mg | ORAL_TABLET | ORAL | Status: DC | PRN

## 2019-06-30 MED ORDER — LACTATED RINGERS IV SOLN: 500.0000 mL | INTRAVENOUS | Status: DC | PRN

## 2019-06-30 MED ORDER — TERBUTALINE SULFATE 1 MG/ML IJ SOLN: 0.2500 mg | Freq: Once | INTRAMUSCULAR | Status: DC | PRN

## 2019-06-30 MED ORDER — LACTATED RINGERS IV SOLN
500.0000 mL | Freq: Once | INTRAVENOUS | Status: AC
  Administered 2019-06-30: 500 mL via INTRAVENOUS

## 2019-06-30 MED ORDER — SOD CITRATE-CITRIC ACID 500-334 MG/5ML PO SOLN: 30.0000 mL | ORAL | Status: DC | PRN

## 2019-06-30 MED ORDER — OXYTOCIN 40 UNITS IN NORMAL SALINE INFUSION - SIMPLE MED
2.5000 [IU]/h | INTRAVENOUS | Status: DC
  Filled 2019-06-30: qty 1000

## 2019-06-30 MED ORDER — SODIUM CHLORIDE (PF) 0.9 % IJ SOLN
INTRAMUSCULAR | Status: DC | PRN
  Administered 2019-06-30: 12 mL/h via EPIDURAL

## 2019-06-30 MED ORDER — MISOPROSTOL 50MCG HALF TABLET
50.0000 ug | ORAL_TABLET | Freq: Once | ORAL | Status: AC
  Administered 2019-06-30: 50 ug via BUCCAL
  Filled 2019-06-30: qty 1

## 2019-06-30 MED ORDER — ONDANSETRON HCL 4 MG/2ML IJ SOLN
4.0000 mg | Freq: Four times a day (QID) | INTRAMUSCULAR | Status: DC | PRN
  Administered 2019-06-30 – 2019-07-01 (×3): 4 mg via INTRAVENOUS
  Filled 2019-06-30 (×3): qty 2

## 2019-06-30 MED ORDER — OXYTOCIN 40 UNITS IN NORMAL SALINE INFUSION - SIMPLE MED
1.0000 m[IU]/min | INTRAVENOUS | Status: DC
  Administered 2019-06-30 – 2019-07-01 (×2): 2 m[IU]/min via INTRAVENOUS
  Administered 2019-07-01: 10 m[IU]/min via INTRAVENOUS
  Administered 2019-07-02: 30 m[IU]/min via INTRAVENOUS
  Administered 2019-07-02: 20 m[IU]/min via INTRAVENOUS
  Administered 2019-07-02: 38 m[IU]/min via INTRAVENOUS
  Filled 2019-06-30: qty 1000

## 2019-06-30 MED ORDER — LACTATED RINGERS IV SOLN: INTRAVENOUS | Status: DC

## 2019-06-30 MED ORDER — OXYCODONE-ACETAMINOPHEN 5-325 MG PO TABS: 2.0000 | ORAL_TABLET | ORAL | Status: DC | PRN

## 2019-06-30 MED ORDER — FENTANYL-BUPIVACAINE-NACL 0.5-0.125-0.9 MG/250ML-% EP SOLN
12.0000 mL/h | EPIDURAL | Status: DC | PRN
  Administered 2019-07-01 – 2019-07-02 (×2): 12 mL/h via EPIDURAL
  Filled 2019-06-30 (×2): qty 250

## 2019-06-30 MED ORDER — EPHEDRINE 5 MG/ML INJ: 10.0000 mg | INTRAVENOUS | Status: DC | PRN

## 2019-06-30 MED ORDER — FLEET ENEMA 7-19 GM/118ML RE ENEM: 1.0000 | ENEMA | RECTAL | Status: DC | PRN

## 2019-06-30 MED ORDER — FENTANYL CITRATE (PF) 100 MCG/2ML IJ SOLN
100.0000 ug | INTRAMUSCULAR | Status: DC | PRN
  Administered 2019-06-30 (×3): 100 ug via INTRAVENOUS
  Filled 2019-06-30 (×3): qty 2

## 2019-06-30 MED ORDER — FENTANYL-BUPIVACAINE-NACL 0.5-0.125-0.9 MG/250ML-% EP SOLN
EPIDURAL | Status: AC
  Filled 2019-06-30: qty 250

## 2019-06-30 MED ORDER — LABETALOL HCL 200 MG PO TABS
400.0000 mg | ORAL_TABLET | Freq: Two times a day (BID) | ORAL | Status: DC
  Administered 2019-06-30 – 2019-07-02 (×5): 400 mg via ORAL
  Filled 2019-06-30 (×5): qty 2

## 2019-06-30 MED ORDER — PHENYLEPHRINE 40 MCG/ML (10ML) SYRINGE FOR IV PUSH (FOR BLOOD PRESSURE SUPPORT): 80.0000 ug | PREFILLED_SYRINGE | INTRAVENOUS | Status: DC | PRN

## 2019-06-30 MED ORDER — MISOPROSTOL 50MCG HALF TABLET
50.0000 ug | ORAL_TABLET | ORAL | Status: DC
  Administered 2019-06-30 (×3): 50 ug via BUCCAL
  Filled 2019-06-30 (×3): qty 1

## 2019-06-30 MED ORDER — LIDOCAINE HCL (PF) 1 % IJ SOLN
30.0000 mL | INTRAMUSCULAR | Status: AC | PRN
  Administered 2019-07-02: 30 mL via SUBCUTANEOUS
  Filled 2019-06-30: qty 30

## 2019-06-30 MED ORDER — OXYTOCIN BOLUS FROM INFUSION
500.0000 mL | Freq: Once | INTRAVENOUS | Status: AC
  Administered 2019-07-02: 500 mL via INTRAVENOUS

## 2019-06-30 MED ORDER — DIPHENHYDRAMINE HCL 50 MG/ML IJ SOLN: 12.5000 mg | INTRAMUSCULAR | Status: DC | PRN

## 2019-06-30 MED ORDER — OXYCODONE-ACETAMINOPHEN 5-325 MG PO TABS: 1.0000 | ORAL_TABLET | ORAL | Status: DC | PRN

## 2019-06-30 MED ORDER — LIDOCAINE-EPINEPHRINE (PF) 2 %-1:200000 IJ SOLN
INTRAMUSCULAR | Status: DC | PRN
  Administered 2019-06-30 (×2): 2 mL via EPIDURAL

## 2019-06-30 NOTE — Progress Notes (Signed)
Labor Progress Note Rachael Dickerson is a 39 y.o. G1P0000 at [redacted]w[redacted]d presented for IOL for cHTN and GDMA2. S: Patient is feeling contractions every couple of minutes. She is starting to feel more uncomfortable and considering an epidural vs pain meds. She would like to eat something and rest.   O:  BP (!) 141/71   Pulse 82   Temp 98.9 F (37.2 C) (Oral)   Resp 16   Ht 5\' 3"  (1.6 m)   Wt 101.4 kg   LMP 10/07/2018 (Approximate)   BMI 39.59 kg/m  EFM: 125, moderate variability, pos accels, no decels, reactive TOCO: q2-3 min  CVE: Dilation: 4 Effacement (%): 40 Station: Ballotable Presentation: Vertex(confirmed by 10/09/2018) Exam by:: dr [email or web address]   Exam repeated by myself and RN and is unchanged   A&P: 39 y.o. G1P0000 [redacted]w[redacted]d here for IOL for cHTN and GDMA2. #Labor: S/p Foley bulb that was placed in clinic and able to be removed on admission. S/p Cyto x2. Will give a third. Pit/AROM later as indicated. Anticipate SVD. #Pain: per patient request. Considering epidural  #FWB: Cat I #GBS negative #cHTN: PTA Labetalol 400mg  BID, will continue home labetalol. Pr/Cr normal. CMP WNL. No severe range blood pressures. Last BP 141/71 #gDMA2: CBGs q4hr, q2hr when active. Last glucose 88.  [redacted]w[redacted]d, MD 9:39 AM

## 2019-06-30 NOTE — Progress Notes (Signed)
Labor Progress Note Rachael Dickerson is a 39 y.o. G1P0000 at [redacted]w[redacted]d presented for IOL for cHTN and GDMA2.  S: Patient is much more comfortable with her epidural in place.    O:  BP (!) 154/92   Pulse 98   Temp 98.6 F (37 C) (Oral)   Resp 18   Ht 5\' 3"  (1.6 m)   Wt 101.4 kg   LMP 10/07/2018 (Approximate)   SpO2 97%   BMI 39.59 kg/m  EFM: 130, moderate variability, pos accels, no decels, reactive TOCO: q2-3 min  CVE: Dilation: 4 Effacement (%): 50 Cervical Position: Posterior Station: Ballotable Presentation: Vertex Exam by:: [email or web address] RN    A&P: 39 y.o. G1P0000 [redacted]w[redacted]d here for IOL for cHTN and GDMA2. #Labor: S/p Foley bulb that was placed in clinic and able to be removed on admission. S/p Cyto x3. Give another cytotec now. Will recheck after 4 hours. Pit/AROM as indicated. Anticipate SVD. #Pain: Epidural  #FWB: Cat I #GBS negative #cHTN: PTA Labetalol 400mg  BID, will continue home labetalol. Pr/Cr normal. CMP WNL. No severe range blood pressures. Last BP 154/92 #gDMA2: CBGs q4hr, q2hr when active. Last glucose 80.  [redacted]w[redacted]d, MD 2:34 PM

## 2019-06-30 NOTE — Progress Notes (Signed)
Labor Progress Note Natosha Bou is a 39 y.o. G1P0000 at [redacted]w[redacted]d presented for IOL for cHTN and GDMA2. S:  No complaints at this time.   O:  BP (!) 141/80   Pulse 96   Temp 98.2 F (36.8 C) (Oral)   Resp 16   Ht 5\' 3"  (1.6 m)   Wt 101.4 kg   LMP 10/07/2018 (Approximate)   SpO2 97%   BMI 39.59 kg/m  EFM: 140/moderate/acels present  CVE: Dilation: 4.5 Effacement (%): 60 Cervical Position: Posterior Station: -3 Presentation: Vertex Exam by:: [email or web address] RN   A&P: 39 y.o. G1P0000 [redacted]w[redacted]d  for IOL for cHTN and GDMA2. #Labor: Progressing well. Continuing on pitocin. #Pain: Epidural #FWB: Cat I #GBS negative #cHTN: Labetalol 400mg  BID most recent pressures 139-140/77-88.  #gDMA2: CBGs q4hr, q2hr when active. Last glucose 97.  [redacted]w[redacted]d, DO 8:48 PM

## 2019-06-30 NOTE — Anesthesia Procedure Notes (Signed)
Epidural Patient location during procedure: OB Start time: 06/30/2019 2:00 PM End time: 06/30/2019 2:15 PM  Staffing Anesthesiologist: Elmer Picker, MD Performed: anesthesiologist   Preanesthetic Checklist Completed: patient identified, IV checked, risks and benefits discussed, monitors and equipment checked, pre-op evaluation and timeout performed  Epidural Patient position: sitting Prep: DuraPrep and site prepped and draped Patient monitoring: continuous pulse ox, blood pressure, heart rate and cardiac monitor Approach: midline Location: L3-L4 Injection technique: LOR air  Needle:  Needle type: Tuohy  Needle gauge: 17 G Needle length: 9 cm Needle insertion depth: 6 cm Catheter type: closed end flexible Catheter size: 19 Gauge Catheter at skin depth: 12 cm Test dose: negative  Assessment Sensory level: T8 Events: blood not aspirated, injection not painful, no injection resistance, no paresthesia and negative IV test  Additional Notes Patient identified. Risks/Benefits/Options discussed with patient including but not limited to bleeding, infection, nerve damage, paralysis, failed block, incomplete pain control, headache, blood pressure changes, nausea, vomiting, reactions to medication both or allergic, itching and postpartum back pain. Confirmed with bedside nurse the patient's most recent platelet count. Confirmed with patient that they are not currently taking any anticoagulation, have any bleeding history or any family history of bleeding disorders. Patient expressed understanding and wished to proceed. All questions were answered. Sterile technique was used throughout the entire procedure. Please see nursing notes for vital signs. Test dose was given through epidural catheter and negative prior to continuing to dose epidural or start infusion. Warning signs of high block given to the patient including shortness of breath, tingling/numbness in hands, complete motor block,  or any concerning symptoms with instructions to call for help. Patient was given instructions on fall risk and not to get out of bed. All questions and concerns addressed with instructions to call with any issues or inadequate analgesia.  Reason for block:procedure for pain

## 2019-06-30 NOTE — Anesthesia Preprocedure Evaluation (Signed)
Anesthesia Evaluation  Patient identified by MRN, date of birth, ID band Patient awake    Reviewed: Allergy & Precautions, NPO status , Patient's Chart, lab work & pertinent test results, reviewed documented beta blocker date and time   Airway Mallampati: III  TM Distance: >3 FB Neck ROM: Full    Dental no notable dental hx.    Pulmonary neg pulmonary ROS, former smoker,    Pulmonary exam normal breath sounds clear to auscultation       Cardiovascular hypertension, Pt. on home beta blockers and Pt. on medications negative cardio ROS Normal cardiovascular exam Rhythm:Regular Rate:Normal     Neuro/Psych negative neurological ROS  negative psych ROS   GI/Hepatic negative GI ROS, Neg liver ROS,   Endo/Other  diabetes, Gestational, Oral Hypoglycemic AgentsMorbid obesity  Renal/GU negative Renal ROS  negative genitourinary   Musculoskeletal negative musculoskeletal ROS (+)   Abdominal   Peds  Hematology negative hematology ROS (+)   Anesthesia Other Findings   Reproductive/Obstetrics (+) Pregnancy                             Anesthesia Physical Anesthesia Plan  ASA: III  Anesthesia Plan: Epidural   Post-op Pain Management:    Induction:   PONV Risk Score and Plan: Treatment may vary due to age or medical condition  Airway Management Planned: Natural Airway  Additional Equipment:   Intra-op Plan:   Post-operative Plan:   Informed Consent: I have reviewed the patients History and Physical, chart, labs and discussed the procedure including the risks, benefits and alternatives for the proposed anesthesia with the patient or authorized representative who has indicated his/her understanding and acceptance.       Plan Discussed with: Anesthesiologist  Anesthesia Plan Comments: (Patient identified. Risks, benefits, options discussed with patient including but not limited to  bleeding, infection, nerve damage, paralysis, failed block, incomplete pain control, headache, blood pressure changes, nausea, vomiting, reactions to medication, itching, and post partum back pain. Confirmed with bedside nurse the patient's most recent platelet count. Confirmed with the patient that they are not taking any anticoagulation, have any bleeding history or any family history of bleeding disorders. Patient expressed understanding and wishes to proceed. All questions were answered. )        Anesthesia Quick Evaluation

## 2019-06-30 NOTE — Progress Notes (Signed)
Updated by RN that CVE remains unchanged. Head not well applied, will increase pitocin at this time.  130/moderate/acels present  Plan to recheck around 0200, may consider AROM at that time.  Jackelyn Poling, DO

## 2019-06-30 NOTE — Progress Notes (Signed)
Labor Progress Note Rachael Dickerson is a 39 y.o. G1P0000 at [redacted]w[redacted]d presented for IOL for cHTN and GDMA2.  S: Patient is uncomfortable and feeling contractions through the pain medicine. She would like an epidural now.   O:  BP (!) 146/82   Pulse 79   Temp 98.6 F (37 C) (Oral)   Resp 18   Ht 5\' 3"  (1.6 m)   Wt 101.4 kg   LMP 10/07/2018 (Approximate)   BMI 39.59 kg/m  EFM: 130, moderate variability, pos accels, no decels, reactive TOCO: q2-3 min  CVE: Dilation: 4 Effacement (%): 50 Station: Ballotable Presentation: Vertex Exam by:: Dr. [email or web address]    A&P: 39 y.o. G1P0000 [redacted]w[redacted]d here for IOL for cHTN and GDMA2. #Labor: S/p Foley bulb that was placed in clinic and able to be removed on admission. S/p Cyto x3. Patient is getting an epidural. Will recheck after. Pit/AROM as indicated. Anticipate SVD. #Pain: Epidural  #FWB: Cat I #GBS negative #cHTN: PTA Labetalol 400mg  BID, will continue home labetalol. Pr/Cr normal. CMP WNL. No severe range blood pressures. Last BP 146/82 #gDMA2: CBGs q4hr, q2hr when active. Last glucose 73.  08-31-1983, MD 1:36 PM

## 2019-06-30 NOTE — Progress Notes (Addendum)
Labor Progress Note Rachael Dickerson is a 39 y.o. G1P0000 at [redacted]w[redacted]d presented for IOL for cHTN and GDMA2. S: Has been comfortable and sleeping, now requesting pain meds.   O:  BP (!) 147/83   Pulse 88   Temp 98.5 F (36.9 C) (Oral)   Resp 16   Ht 5\' 3"  (1.6 m)   Wt 101.4 kg   LMP 10/07/2018 (Approximate)   BMI 39.59 kg/m  EFM: 125, moderate variability, pos accels, no decels, reactive TOCO: unable to trace currently  CVE: Dilation: 4 Effacement (%): 40 Station: Ballotable Presentation: Vertex(confirmed by 10/09/2018) Exam by:: dr [email or web address]    A&P: 39 y.o. G1P0000 [redacted]w[redacted]d here for IOL for cHTN and GDMA2. #Labor: S/p Foley bulb that was placed in clinic and able to be removed on admission. S/p Cyto x1. Will give second. Pit/AROM later as indicated. Anticipate SVD. #Pain: per patient request  #FWB: Cat I #GBS negative #cHTN: PTA Labetalol 400mg  BID, will continue home labetalol. Pr/Cr pending. CMP WNL. #gDMA2: CBGs q4hr, q2hr when active. Last glucose 88.  [redacted]w[redacted]d, MD 5:30 AM

## 2019-07-01 LAB — GLUCOSE, CAPILLARY
Glucose-Capillary: 70 mg/dL (ref 70–99)
Glucose-Capillary: 77 mg/dL (ref 70–99)
Glucose-Capillary: 78 mg/dL (ref 70–99)
Glucose-Capillary: 87 mg/dL (ref 70–99)
Glucose-Capillary: 88 mg/dL (ref 70–99)
Glucose-Capillary: 93 mg/dL (ref 70–99)

## 2019-07-01 MED ORDER — METOCLOPRAMIDE HCL 5 MG/ML IJ SOLN
10.0000 mg | Freq: Four times a day (QID) | INTRAMUSCULAR | Status: DC | PRN
  Administered 2019-07-01 – 2019-07-02 (×4): 10 mg via INTRAVENOUS
  Filled 2019-07-01 (×4): qty 2

## 2019-07-01 MED ORDER — METOCLOPRAMIDE HCL 5 MG/ML IJ SOLN
5.0000 mg | Freq: Once | INTRAMUSCULAR | Status: AC
  Administered 2019-07-01: 5 mg via INTRAVENOUS
  Filled 2019-07-01: qty 2

## 2019-07-01 MED ORDER — ONDANSETRON HCL 4 MG/2ML IJ SOLN
4.0000 mg | Freq: Four times a day (QID) | INTRAMUSCULAR | Status: DC | PRN
  Administered 2019-07-01 – 2019-07-02 (×4): 4 mg via INTRAVENOUS
  Filled 2019-07-01 (×4): qty 2

## 2019-07-01 NOTE — Progress Notes (Signed)
Labor Progress Note Mersadie Kavanaugh is a 39 y.o. G1P0000 at [redacted]w[redacted]d presented for IOL for cHTN and gDMA2  S:  Patient not feeling contractions with the epidural. She feels comfortable.  O:  BP 140/85   Pulse 86   Temp 97.9 F (36.6 C) (Oral)   Resp 18   Ht 5\' 3"  (1.6 m)   Wt 101.4 kg   LMP 10/07/2018 (Approximate)   SpO2 97%   BMI 39.59 kg/m  EFM: 125/moderate variability/acels present/absent decels  CVE: Dilation: 4.5 Effacement (%): 90 Cervical Position: Anterior Station: -3 Presentation: Vertex Exam by:: [email or web address] RN   IUPC placed by Dr. Viona Gilmore. Patient tolerated the procedure well.    A&P: 39 y.o. G1P0000 [redacted]w[redacted]d here for IOL for cHTN and gDMA2 #Labor: s/p FB and cytotec x4. SROM ~0215. No change from prior exam. IUPC placed to better titrate pitocin based on contraction adequacy. Continue pitocin. #Pain: Epidural #FWB: Cat I #GBS negative #cHTN: Labetalol 400mg  BID, systolic pressures in the 130s-150s. No severe range pressures. Most recent BP 140/85. #gDMA2: CBGs q4hr, q2hr when active. Most recent glucose: 78 #Nausea: zofran PRN. Received one dose of reglan this morning.  07-13-1971, MD 10:53 AM

## 2019-07-01 NOTE — Progress Notes (Signed)
Labor Progress Note Rachael Dickerson is a 39 y.o. G1P0000 at [redacted]w[redacted]d presented for IOL for cHTN and gDMA2  S:  Patient not feeling contractions with the epidural. She feels comfortable.  O:  BP 136/70   Pulse 86   Temp 98.6 F (37 C) (Oral)   Resp 18   Ht 5\' 3"  (1.6 m)   Wt 101.4 kg   LMP 10/07/2018 (Approximate)   SpO2 97%   BMI 39.59 kg/m  EFM: 125/moderate variability/acels present/absent decels  CVE: Dilation: 4 Effacement (%): 100 Cervical Position: Anterior Station: -2 Presentation: Vertex Exam by:: Dr. [email or web address]   IUPC replaced by Dr. Crissie Reese. Patient tolerated the procedure well.    A&P: 40 y.o. G1P0000 [redacted]w[redacted]d here for IOL for cHTN and gDMA2 #Labor: s/p FB and cytotec x4. SROM ~0215. No change from prior exam. IUPC placed to better titrate pitocin based on contraction adequacy. Continue pitocin. #Pain: Epidural #FWB: Cat I #GBS negative #cHTN: Labetalol 400mg  BID, systolic pressures in the 130s-150s. No severe range pressures. Most recent BP 127/74. #gDMA2: CBGs q4hr, q2hr when active. Most recent glucose: 93 #Nausea: Reglan PRN.  07-13-1971, MD 1:44 PM

## 2019-07-01 NOTE — Progress Notes (Signed)
Labor Progress Note Rachael Dickerson is a 39 y.o. G1P0000 at [redacted]w[redacted]d presented for IOL for cHTN and gDMA2.  S:  No complaints. O:  BP 138/73   Pulse 79   Temp 98.3 F (36.8 C) (Oral)   Resp 17   Ht 5\' 3"  (1.6 m)   Wt 101.4 kg   LMP 10/07/2018 (Approximate)   SpO2 97%   BMI 39.59 kg/m  EFM: 130/moderate/acels present  CVE: Dilation: 5 Effacement (%): 100 Cervical Position: Anterior Station: -1 Presentation: Vertex Exam by:: dr [email or web address]   A&P: 39 y.o. G1P0000 [redacted]w[redacted]d  for IOL for cHTN and gDMA2. #Labor: Patient has been ruptured since 0226 3/18. Small sac of bulging membranes was palpated and AROM performed on this check. Will restart pitocin, recheck in 4 hours with the hope that her contractions will becomed adequate, IUPC still in place. #Pain: Analgesia PRN #FWB: Cat I #GBS negative #cHTN: Labetalol 400mg  BID. Pressures ranging from 138-151/73-104 over last 12 hours. #gDMA2: CBGs q4hr, q2hr when active. Most recent glucose 87.   Method of delivery discussed in detail with patient and her husband with hopes of getting her in active labor to make cervical change. We did discuss the potential need for C/S if adequate labor cannot be achieved. Patient is amendable to trying aggressive position changes and restarting pitocin to make sure that we can attempt to give her a vaginal delivery.  4/18, DO 9:26 PM

## 2019-07-01 NOTE — Progress Notes (Signed)
Labor Progress Note Rachael Dickerson is a 39 y.o. G1P0000 at [redacted]w[redacted]d presented for IOL for cHTN and gDMA2  S:  Feeling some nausea but not complaining of pain. O:  BP (!) 158/94   Pulse 82   Temp 98.7 F (37.1 C) (Oral)   Resp 16   Ht 5\' 3"  (1.6 m)   Wt 101.4 kg   LMP 10/07/2018 (Approximate)   SpO2 97%   BMI 39.59 kg/m  EFM: 140/moderate/acels present  CVE: Dilation: 4.5 Effacement (%): 80 Cervical Position: Middle Station: -3 Presentation: Vertex Exam by:: [email or web address] RN   A&P: 39 y.o. G1P0000 [redacted]w[redacted]d here for IOL for cHTN and gDMA2 #Labor: Making some changes. Consider IUPC at next exam. #Pain: Analgesia PRN #FWB: Cat I #GBS negative #cHTN: Labetalol 400mg  BID, systolic pressures in the 140s-150s, had a 160/86 last night but patient stated she was laying on her bp cuff. #gDMA2: CBGs q4hr, q2hr when active. Most recent glucose:70 #Nausea: zofran PRN  06-21[birth year]1980, DO 6:48 AM

## 2019-07-01 NOTE — Progress Notes (Signed)
Labor Progress Note Rachael Dickerson is a 39 y.o. G1P0000 at [redacted]w[redacted]d presented for IOL for cHTN and gDMA2  S:  Patient still feeling comfortable. She is not discouraged about her slow progress or taking a pit break.  O:  BP (!) 148/104   Pulse 88   Temp 97.8 F (36.6 C) (Oral)   Resp 18   Ht 5\' 3"  (1.6 m)   Wt 101.4 kg   LMP 10/07/2018 (Approximate)   SpO2 97%   BMI 39.59 kg/m  EFM: 130/moderate variability/acels present/absent decels  CVE: Dilation: 4.5 Effacement (%): 100 Cervical Position: Anterior Station: -1 Presentation: Vertex Exam by:: Dr [email or web address]  Toco:  100 MVU max   A&P: 39 y.o. G1P0000 [redacted]w[redacted]d here for IOL for cHTN and gDMA2 #Labor: s/p FB and cytotec x4. SROM ~0215. Pitocin started at 1907. IUPC placed and pitocin continually increased all the way up to 34. Minimal change from prior exam. Inadequate MVU's: consistently less than 100 since IUPC placement. Plan for pitocin break for 4 hours. Reassess around 2100.  #Pain: Epidural #FWB: Cat I #GBS negative #cHTN: Labetalol 400mg  BID, systolic pressures in the 130s-150s. No severe range pressures. Most recent BP 142/87. #gDMA2: CBGs q4hr, q2hr when active. Most recent glucose: 88 #Nausea: Reglan PRN.  2101, MD 5:45 PM

## 2019-07-01 NOTE — Progress Notes (Addendum)
Labor Progress Note Rachael Dickerson is a 39 y.o. G1P0000 at [redacted]w[redacted]d presented for IOL for cHTN and GDMA2.  S: Not having pain with contractions. O:  BP 116/76   Pulse 80   Temp 98.6 F (37 C) (Oral)   Resp 15   Ht 5\' 3"  (1.6 m)   Wt 101.4 kg   LMP 10/07/2018 (Approximate)   SpO2 97%   BMI 39.59 kg/m  EFM: 140/moderate/no acels, no decels  CVE: Dilation: 4 Effacement (%): 70 Cervical Position: Posterior Station: -3 Presentation: Vertex Exam by:: dr [email or web address]   A&P: 39 y.o. G1P0000 [redacted]w[redacted]d for IOL for cHTN and GDMA2.  #Labor: Bedside ultrasound performed showing LOP/LOT position. Will try to spin baby with aggressive position changes. #Pain: Analgesia PRN #FWB: Cat I #GBS negative #cHTN: Labetalol 400mg  BID #gDMA2: CBGs q4hr, q2hr when active. Last glucose 72  [redacted]w[redacted]d, DO 1:59 AM

## 2019-07-02 ENCOUNTER — Encounter (HOSPITAL_COMMUNITY): Payer: Self-pay | Admitting: Obstetrics & Gynecology

## 2019-07-02 DIAGNOSIS — O1002 Pre-existing essential hypertension complicating childbirth: Secondary | ICD-10-CM | POA: Diagnosis not present

## 2019-07-02 DIAGNOSIS — O114 Pre-existing hypertension with pre-eclampsia, complicating childbirth: Secondary | ICD-10-CM | POA: Diagnosis not present

## 2019-07-02 DIAGNOSIS — O09513 Supervision of elderly primigravida, third trimester: Secondary | ICD-10-CM | POA: Diagnosis not present

## 2019-07-02 DIAGNOSIS — Z3A37 37 weeks gestation of pregnancy: Secondary | ICD-10-CM | POA: Diagnosis not present

## 2019-07-02 DIAGNOSIS — O24420 Gestational diabetes mellitus in childbirth, diet controlled: Secondary | ICD-10-CM | POA: Diagnosis not present

## 2019-07-02 DIAGNOSIS — O1410 Severe pre-eclampsia, unspecified trimester: Secondary | ICD-10-CM

## 2019-07-02 DIAGNOSIS — Z8759 Personal history of other complications of pregnancy, childbirth and the puerperium: Secondary | ICD-10-CM

## 2019-07-02 LAB — GLUCOSE, CAPILLARY
Glucose-Capillary: 91 mg/dL (ref 70–99)
Glucose-Capillary: 83 mg/dL (ref 70–99)
Glucose-Capillary: 109 mg/dL — ABNORMAL HIGH (ref 70–99)
Glucose-Capillary: 77 mg/dL (ref 70–99)
Glucose-Capillary: 79 mg/dL (ref 70–99)

## 2019-07-02 MED ORDER — IBUPROFEN 600 MG PO TABS
600.0000 mg | ORAL_TABLET | Freq: Four times a day (QID) | ORAL | Status: DC
  Administered 2019-07-02 – 2019-07-04 (×8): 600 mg via ORAL
  Filled 2019-07-02 (×8): qty 1

## 2019-07-02 MED ORDER — ONDANSETRON HCL 4 MG PO TABS: 4.0000 mg | ORAL_TABLET | ORAL | Status: DC | PRN

## 2019-07-02 MED ORDER — LABETALOL HCL 5 MG/ML IV SOLN
INTRAVENOUS | Status: AC
  Filled 2019-07-02: qty 4

## 2019-07-02 MED ORDER — ENALAPRIL MALEATE 5 MG PO TABS
5.0000 mg | ORAL_TABLET | Freq: Every day | ORAL | Status: DC
  Administered 2019-07-02 – 2019-07-03 (×2): 5 mg via ORAL
  Filled 2019-07-02 (×2): qty 1

## 2019-07-02 MED ORDER — TRANEXAMIC ACID-NACL 1000-0.7 MG/100ML-% IV SOLN
INTRAVENOUS | Status: AC
  Administered 2019-07-02: 1000 mg
  Filled 2019-07-02: qty 100

## 2019-07-02 MED ORDER — OXYCODONE HCL 5 MG PO TABS: 10.0000 mg | ORAL_TABLET | ORAL | Status: DC | PRN

## 2019-07-02 MED ORDER — MAGNESIUM SULFATE 40 GM/1000ML IV SOLN
INTRAVENOUS | Status: AC
  Filled 2019-07-02: qty 1000

## 2019-07-02 MED ORDER — ENALAPRIL MALEATE 5 MG PO TABS
5.0000 mg | ORAL_TABLET | Freq: Every day | ORAL | Status: DC
  Filled 2019-07-02: qty 1

## 2019-07-02 MED ORDER — SIMETHICONE 80 MG PO CHEW: 80.0000 mg | CHEWABLE_TABLET | ORAL | Status: DC | PRN

## 2019-07-02 MED ORDER — ACETAMINOPHEN 325 MG PO TABS
650.0000 mg | ORAL_TABLET | ORAL | Status: DC | PRN
  Administered 2019-07-03 – 2019-07-04 (×4): 650 mg via ORAL
  Filled 2019-07-02 (×4): qty 2

## 2019-07-02 MED ORDER — MAGNESIUM SULFATE BOLUS VIA INFUSION
4.0000 g | Freq: Once | INTRAVENOUS | Status: AC
  Administered 2019-07-02: 4 g via INTRAVENOUS
  Filled 2019-07-02: qty 1000

## 2019-07-02 MED ORDER — BENZOCAINE-MENTHOL 20-0.5 % EX AERO
1.0000 "application " | INHALATION_SPRAY | CUTANEOUS | Status: DC | PRN
  Filled 2019-07-02: qty 56

## 2019-07-02 MED ORDER — MISOPROSTOL 200 MCG PO TABS
800.0000 ug | ORAL_TABLET | Freq: Once | ORAL | Status: AC
  Administered 2019-07-02: 800 ug via RECTAL

## 2019-07-02 MED ORDER — DIPHENHYDRAMINE HCL 25 MG PO CAPS: 25.0000 mg | ORAL_CAPSULE | Freq: Four times a day (QID) | ORAL | Status: DC | PRN

## 2019-07-02 MED ORDER — OXYTOCIN 10 UNIT/ML IJ SOLN
INTRAMUSCULAR | Status: AC
  Filled 2019-07-02: qty 1

## 2019-07-02 MED ORDER — MAGNESIUM SULFATE 40 GM/1000ML IV SOLN
2.0000 g/h | INTRAVENOUS | Status: AC
  Administered 2019-07-02 – 2019-07-03 (×2): 2 g/h via INTRAVENOUS
  Filled 2019-07-02: qty 1000

## 2019-07-02 MED ORDER — METHYLERGONOVINE MALEATE 0.2 MG PO TABS: 0.2000 mg | ORAL_TABLET | ORAL | Status: DC | PRN

## 2019-07-02 MED ORDER — OXYTOCIN 40 UNITS IN NORMAL SALINE INFUSION - SIMPLE MED: 2.5000 [IU]/h | INTRAVENOUS | Status: DC | PRN

## 2019-07-02 MED ORDER — PRENATAL MULTIVITAMIN CH
1.0000 | ORAL_TABLET | Freq: Every day | ORAL | Status: DC
  Administered 2019-07-03 – 2019-07-04 (×2): 1 via ORAL
  Filled 2019-07-02 (×2): qty 1

## 2019-07-02 MED ORDER — WITCH HAZEL-GLYCERIN EX PADS: 1.0000 "application " | MEDICATED_PAD | CUTANEOUS | Status: DC | PRN

## 2019-07-02 MED ORDER — DIBUCAINE (PERIANAL) 1 % EX OINT: 1.0000 "application " | TOPICAL_OINTMENT | CUTANEOUS | Status: DC | PRN

## 2019-07-02 MED ORDER — LABETALOL HCL 5 MG/ML IV SOLN
20.0000 mg | INTRAVENOUS | Status: DC | PRN
  Administered 2019-07-02: 20 mg via INTRAVENOUS
  Filled 2019-07-02: qty 4

## 2019-07-02 MED ORDER — COCONUT OIL OIL: 1.0000 "application " | TOPICAL_OIL | Status: DC | PRN

## 2019-07-02 MED ORDER — HYDRALAZINE HCL 50 MG PO TABS
25.0000 mg | ORAL_TABLET | Freq: Once | ORAL | Status: AC
  Administered 2019-07-02: 25 mg via ORAL
  Filled 2019-07-02: qty 1

## 2019-07-02 MED ORDER — LABETALOL HCL 5 MG/ML IV SOLN
40.0000 mg | INTRAVENOUS | Status: DC | PRN
  Administered 2019-07-03: 40 mg via INTRAVENOUS
  Filled 2019-07-02: qty 8

## 2019-07-02 MED ORDER — PANTOPRAZOLE SODIUM 40 MG IV SOLR
40.0000 mg | INTRAVENOUS | Status: DC
  Administered 2019-07-02: 40 mg via INTRAVENOUS
  Filled 2019-07-02: qty 40

## 2019-07-02 MED ORDER — TRANEXAMIC ACID-NACL 1000-0.7 MG/100ML-% IV SOLN: 1000.0000 mg | INTRAVENOUS | Status: DC

## 2019-07-02 MED ORDER — DIPHENHYDRAMINE HCL 50 MG/ML IJ SOLN
25.0000 mg | Freq: Once | INTRAMUSCULAR | Status: AC
  Administered 2019-07-02: 25 mg via INTRAVENOUS
  Filled 2019-07-02: qty 1

## 2019-07-02 MED ORDER — METHYLERGONOVINE MALEATE 0.2 MG/ML IJ SOLN: 0.2000 mg | INTRAMUSCULAR | Status: DC | PRN

## 2019-07-02 MED ORDER — TETANUS-DIPHTH-ACELL PERTUSSIS 5-2.5-18.5 LF-MCG/0.5 IM SUSP: 0.5000 mL | Freq: Once | INTRAMUSCULAR | Status: DC

## 2019-07-02 MED ORDER — LIDOCAINE-EPINEPHRINE (PF) 2 %-1:200000 IJ SOLN
INTRAMUSCULAR | Status: DC | PRN
  Administered 2019-07-02: 5 mL via EPIDURAL

## 2019-07-02 MED ORDER — LABETALOL HCL 5 MG/ML IV SOLN: 80.0000 mg | INTRAVENOUS | Status: DC | PRN

## 2019-07-02 MED ORDER — ONDANSETRON HCL 4 MG/2ML IJ SOLN: 4.0000 mg | INTRAMUSCULAR | Status: DC | PRN

## 2019-07-02 MED ORDER — MISOPROSTOL 200 MCG PO TABS
ORAL_TABLET | ORAL | Status: AC
  Filled 2019-07-02: qty 4

## 2019-07-02 MED ORDER — OXYCODONE HCL 5 MG PO TABS: 5.0000 mg | ORAL_TABLET | ORAL | Status: DC | PRN

## 2019-07-02 NOTE — Discharge Summary (Signed)
Postpartum Discharge Summary    Patient Name: Rachael Dickerson DOB: 06-09-1980 MRN: [id number]  Date of admission: 06/30/2019 Delivering Provider: Chauncey Mann   Date of discharge: 07/04/2019  Admitting diagnosis: Chronic hypertension with exacerbation during pregnancy [O10.919] Intrauterine pregnancy: [redacted]w[redacted]d    Secondary diagnosis:  Active Problems:   Obesity   Chronic hypertension complicating pregnancy, antepartum   Supervision of high-risk pregnancy   Gestational diabetes mellitus (GDM) affecting pregnancy, antepartum   AMA (advanced maternal age) primigravida 429+ third trimester   Chronic hypertension with exacerbation during pregnancy   Vacuum-assisted vaginal delivery   Severe pre-eclampsia  Additional problems: Ruptured > 24 hours     Discharge diagnosis: Term Pregnancy Delivered, CHTN with superimposed preeclampsia and GDM A2                                                                                                Post partum procedures:None  Augmentation: Pitocin, Cytotec and Foley Balloon  Complications: RJIR>67hours  Hospital course:  Induction of Labor With Vaginal Delivery   39y.o. yo G1P1001 at 374w2das admitted to the hospital 06/30/2019 for induction of labor.  Indication for induction: cHTN and GDMA2.  Patient had an uncomplicated labor course as follows: Patient presented to L&D for IOL for cHTN and GDGaryInitial SVE: 4cm/40%/ballotable. Labor course was complicated by inaqdequate contractions after ~22 hours of pitocin requiring a pitocin break for ~4 hours and then maternal exhaustion after pushing for over two hours and reported maternal exhaustion. Ruptured for 32 hours at time of delivery. She then progressed to complete.  Membrane Rupture Time/Date: 2:26 AM ,07/01/2019   Intrapartum Procedures: Episiotomy: Median [2]                                         Lacerations:  2nd degree [3];Perineal [11]  Patient had delivery of a Viable infant.   Information for the patient's newborn:  JeMeaghann, Choo0[[id number]]Delivery Method: Vag-Vacuum    07/02/2019  Details of delivery can be found in separate delivery note. Patient with several severe-range BP's post-partum and ultimately started on Magnesium which was continued for 24 hours. Patient was started on Enalapril 10 mg post-partum and BP's were in 140-150s/80-90s. Fasting AM glucose normal. Patient is discharged home 07/04/19. Delivery time: 11:20 AM    Magnesium Sulfate received: Yes BMZ received: No Rhophylac:No MMR:N/A Transfusion:No  Physical exam  Vitals:   07/03/19 1604 07/03/19 2000 07/03/19 2335 07/04/19 0325  BP: (!) 148/91 (!) 158/90 (!) 151/79 (!) 147/82  Pulse: 87 81 81 70  Resp: 18 19 20 19   Temp: 97.8 F (36.6 C) 98 F (36.7 C) 98 F (36.7 C) 97.9 F (36.6 C)  TempSrc: Oral Oral Oral Oral  SpO2: 100% 99% 99% 99%  Weight:      Height:       General: alert, cooperative and no distress Lochia: appropriate Uterine Fundus: firm DVT Evaluation: No evidence of DVT seen on physical exam. Negative Homan's  sign. No cords or calf tenderness. No significant calf/ankle edema. Labs: Lab Results  Component Value Date   WBC 15.4 (H) 06/30/2019   HGB 10.4 (L) 06/30/2019   HCT 32.1 (L) 06/30/2019   MCV 85.1 06/30/2019   PLT 365 06/30/2019   CMP Latest Ref Rng & Units 06/30/2019  Glucose 70 - 99 mg/dL 103(H)  BUN 6 - 20 mg/dL 6  Creatinine 0.44 - 1.00 mg/dL 0.62  Sodium 135 - 145 mmol/L 137  Potassium 3.5 - 5.1 mmol/L 4.2  Chloride 98 - 111 mmol/L 104  CO2 22 - 32 mmol/L 19(L)  Calcium 8.9 - 10.3 mg/dL 9.2  Total Protein 6.5 - 8.1 g/dL 6.3(L)  Total Bilirubin 0.3 - 1.2 mg/dL 0.2(L)  Alkaline Phos 38 - 126 U/L 108  AST 15 - 41 U/L 20  ALT 0 - 44 U/L 18   Edinburgh Score: Edinburgh Postnatal Depression Scale Screening Tool 07/02/2019  I have been able to laugh and see the funny side of things. 0  I have looked forward with enjoyment to things.  0  I have blamed myself unnecessarily when things went wrong. 0  I have been anxious or worried for no good reason. 0  I have felt scared or panicky for no good reason. 0  Things have been getting on top of me. 0  I have been so unhappy that I have had difficulty sleeping. 0  I have felt sad or miserable. 0  I have been so unhappy that I have been crying. 0  The thought of harming myself has occurred to me. 0  Edinburgh Postnatal Depression Scale Total 0    Discharge instruction: per After Visit Summary and "Baby and Me Booklet".  After visit meds:  Allergies as of 07/04/2019      Reactions   Cranberry Itching, Swelling   Erythromycin Diarrhea   Latex Rash      Medication List    STOP taking these medications   Accu-Chek FastClix Lancets Misc   aspirin EC 81 MG tablet   blood glucose meter kit and supplies Kit   glucose blood test strip   labetalol 200 MG tablet Commonly known as: NORMODYNE   lansoprazole 15 MG capsule Commonly known as: Prevacid   metFORMIN 500 MG tablet Commonly known as: GLUCOPHAGE   prenatal multivitamin Tabs tablet     TAKE these medications   ascorbic acid 500 MG tablet Commonly known as: VITAMIN C Take 1 tablet (500 mg total) by mouth daily. Take at same time as iron tablet   docusate sodium 100 MG capsule Commonly known as: COLACE Take 1 capsule (100 mg total) by mouth 2 (two) times daily as needed for mild constipation or moderate constipation.   enalapril 10 MG tablet Commonly known as: VASOTEC Take 1 tablet (10 mg total) by mouth daily.   ibuprofen 600 MG tablet Commonly known as: ADVIL Take 1 tablet (600 mg total) by mouth every 6 (six) hours as needed for headache, mild pain, moderate pain or cramping.   iron polysaccharides 150 MG capsule Commonly known as: NIFEREX Take 1 capsule (150 mg total) by mouth daily.       Diet: routine diet  Activity: Advance as tolerated. Pelvic rest for 6 weeks.   Outpatient follow  up:4 weeks Follow up Appt:No future appointments. Follow up Visit: [place] [facility] [place] at [facility] Follow up in 1 week(s).   Specialty: Obstetrics and Gynecology Why: You will be called with  appointment details Contact information: [place], [address] [place] [phone]            Please schedule this patient for Postpartum visit in: 4 weeks with the following provider: MD In-Person For C/S patients schedule nurse incision check in weeks 2 weeks: no High risk pregnancy complicated by: HTN and DM Delivery mode:  Vacuum Anticipated Birth Control:  other/unsure PP Procedures needed: GTT and BP check  Schedule Integrated BH visit: no    Newborn Data: Live born female  Birth Weight: 6 lb 4.4 oz (2846 g) APGAR: 9, 9  Newborn Delivery   Birth date/time: 07/02/2019 11:20:00 Delivery type: Vaginal, Vacuum (Extractor)      Baby Feeding: Bottle Disposition:home with mother   07/04/2019 Verita Schneiders, MD

## 2019-07-02 NOTE — Progress Notes (Signed)
Rachael Dickerson is a 39 y.o. G1P0000 at [redacted]w[redacted]d admitted for induction of labor due to [place] and A2GDM.  Subjective: Comfortable with epidural, not feeling contractions. She is excited to deliver her baby soon.  Objective: BP (!) 154/82   Pulse (!) 104   Temp 98.1 F (36.7 C) (Oral)   Resp 18   Ht 5\' 3"  (1.6 m)   Wt 101.4 kg   LMP 10/07/2018 (Approximate)   SpO2 97%   BMI 39.59 kg/m  Total I/O In: -  Out: 400 [Urine:400]  FHT:  FHR: 130 bpm, variability: moderate,  accelerations:  Present,  decelerations:  Absent UC:   regular, every 2-4 minutes  SVE:   Dilation: 10 Effacement (%): 100 Station: Plus 1 Exam by:: [place], rn  Pitocin @ 38 mu/min  Labs: Lab Results  Component Value Date   WBC 15.4 (H) 06/30/2019   HGB 10.4 (L) 06/30/2019   HCT 32.1 (L) 06/30/2019   MCV 85.1 06/30/2019   PLT 365 06/30/2019    Assessment / Plan: 39 y.o. G1P0000 [redacted]w[redacted]d for IOL for cHTN and gDMA2.  #Labor: Progressing well on pitocin. Currently complete. Starting to push with contractions. #Pain: Comfortable with epidural #FWB: Cat I #GBS: negative #cHTN: Labetalol 400mg  BID. Had a couple severe range pressures when changing position and actively pushing. Most pressures in the 130-140s over 60s-80s since last check. #gDMA2: CBGs q2h, last CBG 79.  Anticipate vaginal delivery  06-21-1980 MD OB Resident, Faculty Practice 07/02/2019, 10:02 AM

## 2019-07-02 NOTE — Progress Notes (Signed)
Labor Progress Note Rachael Dickerson is a 39 y.o. G1P0000 at [redacted]w[redacted]d presented for IOL for cHTN and gDMA2. S:  States nausea is well controlled, feeling tired but not in pain.  O:  BP 132/65   Pulse 73   Temp 98.7 F (37.1 C) (Oral)   Resp 15   Ht 5\' 3"  (1.6 m)   Wt 101.4 kg   LMP 10/07/2018 (Approximate)   SpO2 97%   BMI 39.59 kg/m  EFM: 130/moderate/acels present  CVE: Dilation: 7 Effacement (%): 100 Cervical Position: Anterior Station: 0 Presentation: Vertex Exam by:: dr [email or web address]   A&P: 39 y.o. G1P0000 [redacted]w[redacted]d for IOL for cHTN and gDMA2. #Labor: Some swelling on the anterior lip of the cervix, will give 25mg  benedryl IV and continue progressing on pitocin. Will recheck in 4 hours. Continue with aggressive position changes. #Pain: Epidural #FWB: Cat I #GBS negative #cHTN: Labetalol 400mg  BID. Pressures ranging from 132-135/65-77 since midnight #gDMA2: Monitor glucose q2hr in active labor  06-21-1980, DO 1:37 AM

## 2019-07-02 NOTE — Progress Notes (Signed)
Rachael Dickerson is a 39 y.o. G1P0000 at [redacted]w[redacted]d admitted for induction of labor due to [place] and A2GDM.  Subjective: Comfortable with epidural, not feeling contractions.  Objective: BP (!) 149/82   Pulse 86   Temp 99.1 F (37.3 C)   Resp 17   Ht 5\' 3"  (1.6 m)   Wt 101.4 kg   LMP 10/07/2018 (Approximate)   SpO2 97%   BMI 39.59 kg/m  Total I/O In: -  Out: 1050 [Urine:1050]  FHT:  FHR: 135 bpm, variability: moderate,  accelerations:  Present,  decelerations:  Absent UC:   regular, every 5-6 minutes  SVE:   Dilation: 7 Effacement (%): 100 Station: 0 Exam by:: dr [email or web address]  Pitocin @ 30 mu/min  Labs: Lab Results  Component Value Date   WBC 15.4 (H) 06/30/2019   HGB 10.4 (L) 06/30/2019   HCT 32.1 (L) 06/30/2019   MCV 85.1 06/30/2019   PLT 365 06/30/2019    Assessment / Plan: 39 y.o. G1P0000 [redacted]w[redacted]d for IOL for cHTN and gDMA2.  #Labor: Progressing well on pitocin, still has thick anterior lip. Continue with pitocin and aggressive position changes. #Pain: Comfortable with epidural #FWB: Cat I #GBS: negative #cHTN: Labetalol 400mg  BID. One severe range pressure that resolved without intervention. Most pressures in the 130-140s over 60s-80s since last check. #gDMA2: CBGs q2h, last CBG 83.  Anticipate vaginal delivery  06-21-1980 DO OB Fellow, Faculty [facility] 07/02/2019, 5:50 AM

## 2019-07-03 LAB — GLUCOSE, CAPILLARY: Glucose-Capillary: 85 mg/dL (ref 70–99)

## 2019-07-03 MED ORDER — ASCORBIC ACID 500 MG PO TABS
500.0000 mg | ORAL_TABLET | Freq: Every day | ORAL | Status: DC
  Administered 2019-07-03 – 2019-07-04 (×2): 500 mg via ORAL
  Filled 2019-07-03 (×2): qty 1

## 2019-07-03 MED ORDER — LACTATED RINGERS IV SOLN: INTRAVENOUS | Status: DC

## 2019-07-03 MED ORDER — POLYSACCHARIDE IRON COMPLEX 150 MG PO CAPS
150.0000 mg | ORAL_CAPSULE | Freq: Every day | ORAL | Status: DC
  Administered 2019-07-03 – 2019-07-04 (×2): 150 mg via ORAL
  Filled 2019-07-03 (×2): qty 1

## 2019-07-03 MED ORDER — ENALAPRIL MALEATE 5 MG PO TABS
10.0000 mg | ORAL_TABLET | Freq: Every day | ORAL | Status: DC
  Administered 2019-07-04: 10 mg via ORAL
  Filled 2019-07-03: qty 2

## 2019-07-03 NOTE — Progress Notes (Signed)
Post Partum Day 1 s/p VAVD at [redacted]w[redacted]d; IOL for CHTN with superimposed severe preeclampsia, also has A1GDM.  Subjective: Patient denies any headaches, visual symptoms, RUQ/epigastric pain or other concerning symptoms. No complaints, up ad lib, voiding and tolerating PO.  Minimal perineal pain and lochia.  Objective: Blood pressure 136/77, pulse 76, temperature 98.1 F (36.7 C), temperature source Oral, resp. rate 18, height 5\' 3"  (1.6 m), weight 101.4 kg, last menstrual period 10/07/2018, SpO2 97 %, unknown if currently breastfeeding. Patient Vitals for the past 24 hrs:  BP Temp Temp src Pulse Resp SpO2  07/03/19 0823 136/77 98.1 F (36.7 C) Oral 76 18 97 %  07/03/19 0430 (!) 154/87 -- -- 80 -- 98 %  07/03/19 0415 (!) 178/88 -- -- 91 -- 97 %  07/03/19 0400 (!) 160/92 -- -- 89 20 98 %  07/03/19 0005 (!) 147/83 -- -- 95 18 100 %  07/02/19 2217 (!) 151/77 -- -- 100 19 98 %  07/02/19 2201 (!) 161/81 -- -- (!) 103 (!) 21 99 %  07/02/19 2100 (!) 150/81 -- -- 97 19 98 %  07/02/19 1955 (!) 152/83 97.9 F (36.6 C) Oral 98 20 99 %  07/02/19 1756 (!) 158/96 -- -- 94 18 99 %  07/02/19 1648 (!) 145/74 98.1 F (36.7 C) Oral 97 19 97 %  07/02/19 1533 (!) 141/70 97.9 F (36.6 C) Oral 90 19 98 %  07/02/19 1428 (!) 159/86 -- -- 98 -- 98 %  07/02/19 1412 (!) 168/88 98.3 F (36.8 C) Oral 99 19 98 %  07/02/19 1401 (!) 153/76 -- -- 97 18 --  07/02/19 1350 (!) 155/80 -- -- 96 18 --  07/02/19 1338 (!) 159/82 -- -- 98 18 --  07/02/19 1319 (!) 161/86 -- -- 100 18 --  07/02/19 1248 (!) 157/75 -- -- 90 18 --  07/02/19 1231 (!) 168/94 (!) 97.4 F (36.3 C) Axillary 91 18 --  07/02/19 1218 (!) 163/96 -- -- 97 18 --  07/02/19 1201 (!) 159/87 -- -- 100 18 --  07/02/19 1147 (!) 159/82 -- -- (!) 103 18 --  07/02/19 1021 (!) 156/83 -- -- (!) 110 -- --  07/02/19 1004 -- 99.5 F (37.5 C) Axillary -- -- --  07/02/19 0951 -- -- -- -- 18 --  07/02/19 0931 (!) 154/82 -- -- (!) 104 -- --  07/02/19 0924 -- 98.1 F  (36.7 C) Oral -- -- --  07/02/19 0918 (!) 168/88 -- -- (!) 101 18 --    Physical Exam:  General: alert and no distress Lochia: appropriate Uterine Fundus: firm, NT DVT Evaluation: No evidence of DVT seen on physical exam. Negative Homan's sign.  Recent Labs    06/30/19 1327  HGB 10.4*  HCT 32.1*   CBG (last 3)  Recent Labs    07/02/19 0804 07/02/19 1002 07/03/19 0444  GLUCAP 77 79 85    Assessment/Plan: On Enalapril 5 mg for BP control which will be titrated as needed, magnesium sulfate to stop later this morning. Stable fasting CBGs Analgesia as needed Declines contraception as this baby was a product of IVF, discussed 18 month birth spacing Continue close observation. Patient desires circumcision for her female infant.  Circumcision procedure details discussed, risks and benefits of procedure were also discussed.  These include but are not limited to: Benefits of circumcision in men include reduction in the rates of urinary tract infection (UTI), penile cancer, some sexually transmitted infections, penile inflammatory and retractile disorders, as  well as easier hygiene.  Risks include bleeding , infection, injury of glans which may lead to penile deformity or urinary tract issues, unsatisfactory cosmetic appearance and other potential complications related to the procedure.  It was emphasized that this is an elective procedure.  Patient wants to proceed with circumcision; written informed consent obtained.  Will do circumcision soon, routine circumcision and post circumcision care ordered for the infant.    LOS: 3 days   Rachael Dickerson 07/03/2019, 9:05 AM

## 2019-07-03 NOTE — Anesthesia Postprocedure Evaluation (Signed)
Anesthesia Post Note  Patient: Rachael Dickerson  Procedure(s) Performed: AN AD HOC LABOR EPIDURAL     Patient location during evaluation: Mother Baby Anesthesia Type: Epidural Level of consciousness: awake and alert, oriented and patient cooperative Pain management: pain level controlled Vital Signs Assessment: post-procedure vital signs reviewed and stable Respiratory status: spontaneous breathing Cardiovascular status: stable Postop Assessment: no headache, epidural receding, patient able to bend at knees and no signs of nausea or vomiting Anesthetic complications: no Comments: Pt. States she is walking.  Pain score 0.     Last Vitals:  Vitals:   07/03/19 0415 07/03/19 0430  BP: (!) 178/88 (!) 154/87  Pulse: 91 80  Resp:    Temp:    SpO2: 97% 98%    Last Pain:  Vitals:   07/02/19 2217  TempSrc:   PainSc: 0-No pain   Pain Goal:                   [facility]

## 2019-07-04 MED ORDER — DOCUSATE SODIUM 100 MG PO CAPS: 100.0000 mg | ORAL_CAPSULE | Freq: Two times a day (BID) | ORAL | 2 refills | Status: DC | PRN

## 2019-07-04 MED ORDER — POLYSACCHARIDE IRON COMPLEX 150 MG PO CAPS: 150.0000 mg | ORAL_CAPSULE | Freq: Every day | ORAL | 1 refills | Status: DC

## 2019-07-04 MED ORDER — IBUPROFEN 600 MG PO TABS: 600.0000 mg | ORAL_TABLET | Freq: Four times a day (QID) | ORAL | 2 refills | Status: DC | PRN

## 2019-07-04 MED ORDER — ASCORBIC ACID 500 MG PO TABS: 500.0000 mg | ORAL_TABLET | Freq: Every day | ORAL | 1 refills | Status: DC

## 2019-07-04 MED ORDER — ENALAPRIL MALEATE 10 MG PO TABS: 10.0000 mg | ORAL_TABLET | Freq: Every day | ORAL | 1 refills | Status: DC

## 2019-07-04 NOTE — Discharge Instructions (Signed)
Postpartum Care After Vaginal Delivery This sheet gives you information about how to care for yourself from the time you deliver your baby to up to 6-12 weeks after delivery (postpartum period). Your health care provider may also give you more specific instructions. If you have problems or questions, contact your health care provider. Follow these instructions at home: Vaginal bleeding  It is normal to have vaginal bleeding (lochia) after delivery. Wear a sanitary pad for vaginal bleeding and discharge. ? During the first week after delivery, the amount and appearance of lochia is often similar to a menstrual period. ? Over the next few weeks, it will gradually decrease to a dry, yellow-brown discharge. ? For most women, lochia stops completely by 4-6 weeks after delivery. Vaginal bleeding can vary from woman to woman.  Change your sanitary pads frequently. Watch for any changes in your flow, such as: ? A sudden increase in volume. ? A change in color. ? Large blood clots.  If you pass a blood clot from your vagina, save it and call your health care provider to discuss. Do not flush blood clots down the toilet before talking with your health care provider.  Do not use tampons or douches until your health care provider says this is safe.  If you are not breastfeeding, your period should return 6-8 weeks after delivery. If you are feeding your child breast milk only (exclusive breastfeeding), your period may not return until you stop breastfeeding. Perineal care  Keep the area between the vagina and the anus (perineum) clean and dry as told by your health care provider. Use medicated pads and pain-relieving sprays and creams as directed.  If you had a cut in the perineum (episiotomy) or a tear in the vagina, check the area for signs of infection until you are healed. Check for: ? More redness, swelling, or pain. ? Fluid or blood coming from the cut or tear. ? Warmth. ? Pus or a bad  smell.  You may be given a squirt bottle to use instead of wiping to clean the perineum area after you go to the bathroom. As you start healing, you may use the squirt bottle before wiping yourself. Make sure to wipe gently.  To relieve pain caused by an episiotomy, a tear in the vagina, or swollen veins in the anus (hemorrhoids), try taking a warm sitz bath 2-3 times a day. A sitz bath is a warm water bath that is taken while you are sitting down. The water should only come up to your hips and should cover your buttocks. Breast care  Within the first few days after delivery, your breasts may feel heavy, full, and uncomfortable (breast engorgement). Milk may also leak from your breasts. Your health care provider can suggest ways to help relieve the discomfort. Breast engorgement should go away within a few days.  If you are breastfeeding: ? Wear a bra that supports your breasts and fits you well. ? Keep your nipples clean and dry. Apply creams and ointments as told by your health care provider. ? You may need to use breast pads to absorb milk that leaks from your breasts. ? You may have uterine contractions every time you breastfeed for up to several weeks after delivery. Uterine contractions help your uterus return to its normal size. ? If you have any problems with breastfeeding, work with your health care provider or lactation consultant.  If you are not breastfeeding: ? Avoid touching your breasts a lot. Doing this can make   your breasts produce more milk. ? Wear a good-fitting bra and use cold packs to help with swelling. ? Do not squeeze out (express) milk. This causes you to make more milk. Intimacy and sexuality  Ask your health care provider when you can engage in sexual activity. This may depend on: ? Your risk of infection. ? How fast you are healing. ? Your comfort and desire to engage in sexual activity.  You are able to get pregnant after delivery, even if you have not had  your period. If desired, talk with your health care provider about methods of birth control (contraception). Medicines  Take over-the-counter and prescription medicines only as told by your health care provider.  If you were prescribed an antibiotic medicine, take it as told by your health care provider. Do not stop taking the antibiotic even if you start to feel better. Activity  Gradually return to your normal activities as told by your health care provider. Ask your health care provider what activities are safe for you.  Rest as much as possible. Try to rest or take a nap while your baby is sleeping. Eating and drinking   Drink enough fluid to keep your urine pale yellow.  Eat high-fiber foods every day. These may help prevent or relieve constipation. High-fiber foods include: ? Whole grain cereals and breads. ? Brown rice. ? Beans. ? Fresh fruits and vegetables.  Do not try to lose weight quickly by cutting back on calories.  Take your prenatal vitamins until your postpartum checkup or until your health care provider tells you it is okay to stop. Lifestyle  Do not use any products that contain nicotine or tobacco, such as cigarettes and e-cigarettes. If you need help quitting, ask your health care provider.  Do not drink alcohol, especially if you are breastfeeding. General instructions  Keep all follow-up visits for you and your baby as told by your health care provider. Most women visit their health care provider for a postpartum checkup within the first 3-6 weeks after delivery. Contact a health care provider if:  You feel unable to cope with the changes that your child brings to your life, and these feelings do not go away.  You feel unusually sad or worried.  Your breasts become red, painful, or hard.  You have a fever.  You have trouble holding urine or keeping urine from leaking.  You have little or no interest in activities you used to enjoy.  You have not  breastfed at all and you have not had a menstrual period for 12 weeks after delivery.  You have stopped breastfeeding and you have not had a menstrual period for 12 weeks after you stopped breastfeeding.  You have questions about caring for yourself or your baby.  You pass a blood clot from your vagina. Get help right away if:  You have chest pain.  You have difficulty breathing.  You have sudden, severe leg pain.  You have severe pain or cramping in your lower abdomen.  You bleed from your vagina so much that you fill more than one sanitary pad in one hour. Bleeding should not be heavier than your heaviest period.  You develop a severe headache.  You faint.  You have blurred vision or spots in your vision.  You have bad-smelling vaginal discharge.  You have thoughts about hurting yourself or your baby. If you ever feel like you may hurt yourself or others, or have thoughts about taking your own life, get help   right away. You can go to the nearest emergency department or call:  Your local emergency services (911 in the [place]  A suicide crisis helpline, such as the National Suicide Prevention Lifeline at 1-800-273-8255. This is open 24 hours a day. Summary  The period of time right after you deliver your newborn up to 6-12 weeks after delivery is called the postpartum period.  Gradually return to your normal activities as told by your health care provider.  Keep all follow-up visits for you and your baby as told by your health care provider. This information is not intended to replace advice given to you by your health care provider. Make sure you discuss any questions you have with your health care provider. Document Revised: 04/04/2017 Document Reviewed: 01/13/2017 Elsevier Patient Education  2020 [employer] Postpartum Hypertension Postpartum hypertension is high blood pressure that remains higher than normal after childbirth. You may not realize that you have  postpartum hypertension if your blood pressure is not being checked regularly. In most cases, postpartum hypertension will go away on its own, usually within a week of delivery. However, for some women, medical treatment is required to prevent serious complications, such as seizures or stroke. What are the causes? This condition may be caused by one or more of the following:  Hypertension that existed before pregnancy (chronic hypertension).  Hypertension that comes on as a result of pregnancy (gestational hypertension).  Hypertensive disorders during pregnancy (preeclampsia) or seizures in women who have high blood pressure during pregnancy (eclampsia).  A condition in which the liver, platelets, and red blood cells are damaged during pregnancy (HELLP syndrome).  A condition in which the thyroid produces too much hormones (hyperthyroidism).  Other rare problems of the nerves (neurological disorders) or blood disorders. In some cases, the cause may not be known. What increases the risk? The following factors may make you more likely to develop this condition:  Chronic hypertension. In some cases, this may not have been diagnosed before pregnancy.  Obesity.  Type 2 diabetes.  Kidney disease.  History of preeclampsia or eclampsia.  Other medical conditions that change the level of hormones in the body (hormonal imbalance). What are the signs or symptoms? As with all types of hypertension, postpartum hypertension may not have any symptoms. Depending on how high your blood pressure is, you may experience:  Headaches. These may be mild, moderate, or severe. They may also be steady, constant, or sudden in onset (thunderclap headache).  Changes in your ability to see (visual changes).  Dizziness.  Shortness of breath.  Swelling of your hands, feet, lower legs, or face. In some cases, you may have swelling in more than one of these locations.  Heart palpitations or a racing  heartbeat.  Difficulty breathing while lying down.  Decrease in the amount of urine that you pass. Other rare signs and symptoms may include:  Sweating more than usual. This lasts longer than a few days after delivery.  Chest pain.  Sudden dizziness when you get up from sitting or lying down.  Seizures.  Nausea or vomiting.  Abdominal pain. How is this diagnosed? This condition may be diagnosed based on the results of a physical exam, blood pressure measurements, and blood and urine tests. You may also have other tests, such as a CT scan or an MRI, to check for other problems of postpartum hypertension. How is this treated? If blood pressure is high enough to require treatment, your options may include:  Medicines to reduce blood pressure (  antihypertensives). Tell your health care provider if you are breastfeeding or if you plan to breastfeed. There are many antihypertensive medicines that are safe to take while breastfeeding.  Stopping medicines that may be causing hypertension.  Treating medical conditions that are causing hypertension.  Treating the complications of hypertension, such as seizures, stroke, or kidney problems. Your health care provider will also continue to monitor your blood pressure closely until it is within a safe range for you. Follow these instructions at home:  Take over-the-counter and prescription medicines only as told by your health care provider.  Return to your normal activities as told by your health care provider. Ask your health care provider what activities are safe for you.  Do not use any products that contain nicotine or tobacco, such as cigarettes and e-cigarettes. If you need help quitting, ask your health care provider.  Keep all follow-up visits as told by your health care provider. This is important. Contact a health care provider if:  Your symptoms get worse.  You have new symptoms, such as: ? A headache that does not get  better. ? Dizziness. ? Visual changes. Get help right away if:  You suddenly develop swelling in your hands, ankles, or face.  You have sudden, rapid weight gain.  You develop difficulty breathing, chest pain, racing heartbeat, or heart palpitations.  You develop severe pain in your abdomen.  You have any symptoms of a stroke. "BE FAST" is an easy way to remember the main warning signs of a stroke: ? B - Balance. Signs are dizziness, sudden trouble walking, or loss of balance. ? E - Eyes. Signs are trouble seeing or a sudden change in vision. ? F - Face. Signs are sudden weakness or numbness of the face, or the face or eyelid drooping on one side. ? A - Arms. Signs are weakness or numbness in an arm. This happens suddenly and usually on one side of the body. ? S - Speech. Signs are sudden trouble speaking, slurred speech, or trouble understanding what people say. ? T - Time. Time to call emergency services. Write down what time symptoms started.  You have other signs of a stroke, such as: ? A sudden, severe headache with no known cause. ? Nausea or vomiting. ? Seizure. These symptoms may represent a serious problem that is an emergency. Do not wait to see if the symptoms will go away. Get medical help right away. Call your local emergency services (911 in the [place] Do not drive yourself to the hospital. Summary  Postpartum hypertension is high blood pressure that remains higher than normal after childbirth.  In most cases, postpartum hypertension will go away on its own, usually within a week of delivery.  For some women, medical treatment is required to prevent serious complications, such as seizures or stroke. This information is not intended to replace advice given to you by your health care provider. Make sure you discuss any questions you have with your health care provider. Document Revised: 05/08/2018 Document Reviewed: 01/20/2017 Elsevier Patient Education  2020 Elsevier  Inc.  

## 2019-07-04 NOTE — Progress Notes (Signed)
Pt ambulated at discharge. All belongings sent with patient. Pt's baby placed in car seat. Both patients in stable condition.

## 2019-07-04 NOTE — Progress Notes (Signed)
Pt given discharge instructions for her and her baby. Father of baby also at bedside. All questions answered. IV discontinued.

## 2019-07-05 ENCOUNTER — Telehealth: Payer: Self-pay | Admitting: *Deleted

## 2019-07-05 NOTE — Telephone Encounter (Signed)
Left patient a message to call and schedule 1 week BP check and 4 week Postpartum, both in office.

## 2019-07-05 NOTE — Telephone Encounter (Signed)
-----   Message from Joselyn Arrow, MD sent at 07/02/2019 12:32 PM EDT ----- Regarding: Post Partum Appt Please schedule this patient for Postpartum visit in: 4 weeks with the following provider: MD In-Person For C/S patients schedule nurse incision check in weeks 2 weeks: no High risk pregnancy complicated by: HTN and DM Delivery mode:  Vacuum Anticipated Birth Control:  other/unsure PP Procedures needed: GTT and BP check  Schedule Integrated BH visit: no

## 2019-07-06 ENCOUNTER — Telehealth: Payer: Self-pay | Admitting: Obstetrics & Gynecology

## 2019-07-06 NOTE — Telephone Encounter (Signed)
BPs are 150s-160s/90s-100s  Increase enalapril to 20 mg daily.  Pt will continue to report BPs

## 2019-07-07 ENCOUNTER — Encounter: Payer: Self-pay | Admitting: *Deleted

## 2019-07-08 ENCOUNTER — Other Ambulatory Visit: Payer: Self-pay | Admitting: Obstetrics & Gynecology

## 2019-07-08 MED ORDER — HYDROCHLOROTHIAZIDE 12.5 MG PO CAPS: 12.5000 mg | ORAL_CAPSULE | Freq: Every day | ORAL | 0 refills | Status: DC

## 2019-07-08 NOTE — Progress Notes (Signed)
BP is still elevated on 20 mg enalapril.   Adding HCTZ 12.5 mg q am.

## 2019-07-09 ENCOUNTER — Encounter: Payer: BC Managed Care – PPO | Admitting: Certified Nurse Midwife

## 2019-07-20 ENCOUNTER — Other Ambulatory Visit: Payer: Self-pay | Admitting: Advanced Practice Midwife

## 2019-07-20 ENCOUNTER — Other Ambulatory Visit: Payer: Self-pay | Admitting: Obstetrics & Gynecology

## 2019-07-20 DIAGNOSIS — O165 Unspecified maternal hypertension, complicating the puerperium: Secondary | ICD-10-CM

## 2019-07-20 MED ORDER — ENALAPRIL MALEATE 20 MG PO TABS: 20.0000 mg | ORAL_TABLET | Freq: Every day | ORAL | 1 refills | Status: DC

## 2019-07-20 NOTE — Progress Notes (Signed)
Rx renewed for Vasotec 20 mg daily.

## 2019-08-02 ENCOUNTER — Other Ambulatory Visit (HOSPITAL_COMMUNITY)
Admission: RE | Admit: 2019-08-02 | Discharge: 2019-08-02 | Disposition: A | Discharge status: Home / Self Care | Payer: BC Managed Care – PPO | Source: Ambulatory Visit | Attending: Obstetrics & Gynecology | Admitting: Obstetrics & Gynecology

## 2019-08-02 ENCOUNTER — Other Ambulatory Visit: Payer: Self-pay

## 2019-08-02 ENCOUNTER — Other Ambulatory Visit: Payer: Self-pay | Admitting: Obstetrics & Gynecology

## 2019-08-02 ENCOUNTER — Encounter: Payer: Self-pay | Admitting: Obstetrics & Gynecology

## 2019-08-02 ENCOUNTER — Ambulatory Visit (INDEPENDENT_AMBULATORY_CARE_PROVIDER_SITE_OTHER): Payer: BC Managed Care – PPO | Admitting: Obstetrics & Gynecology

## 2019-08-02 VITALS — BP 113/71 | HR 81 | Resp 16 | Ht 65.0 in | Wt 193.0 lb

## 2019-08-02 DIAGNOSIS — Z0000 Encounter for general adult medical examination without abnormal findings: Secondary | ICD-10-CM

## 2019-08-02 DIAGNOSIS — Z8742 Personal history of other diseases of the female genital tract: Secondary | ICD-10-CM | POA: Diagnosis not present

## 2019-08-02 DIAGNOSIS — Z1231 Encounter for screening mammogram for malignant neoplasm of breast: Secondary | ICD-10-CM

## 2019-08-02 DIAGNOSIS — Z8632 Personal history of gestational diabetes: Secondary | ICD-10-CM | POA: Insufficient documentation

## 2019-08-02 MED ORDER — NORETHINDRONE 0.35 MG PO TABS: 1.0000 | ORAL_TABLET | Freq: Every day | ORAL | 11 refills | Status: DC

## 2019-08-02 NOTE — Progress Notes (Signed)
    Post Partum Visit Note  Rachael Dickerson is a 39 y.o. G74P1001 female who presents for a postpartum visit. She is 4 weeks postpartum following an IOL @ 38 wks for GHTN and GD.  She did have a 2nd degree episiotomy.   I have fully reviewed the prenatal and intrapartum course. The delivery was at [redacted]w[redacted]d gestational weeks.  Anesthesia: epidural. Postpartum course has been unremarkable. Baby is doing well. Baby is feeding by bottle - Gerber Gentle Pro. Bleeding no bleeding. Bowel function is normal. Bladder function is normal. Patient is not sexually active. Contraception method is none. Postpartum depression screening:  negative The following portions of the patient's history were reviewed and updated as appropriate: allergies, current medications, past family history, past medical history, past social history, past surgical history and problem list.  Review of Systems Pertinent items noted in HPI and remainder of comprehensive ROS otherwise negative.    Objective:  Last menstrual period 10/07/2018, unknown if currently breastfeeding.  General:  alert, cooperative and no distress   Breasts:  inspection negative, no nipple discharge or bleeding, no masses or nodularity palpable  Lungs: clear to auscultation bilaterally  Heart:  regular rate and rhythm  Abdomen: soft, non-tender; bowel sounds normal; no masses,  no organomegaly   Vulva:  normal  Vagina: normal vagina  Cervix:  no lesions  Corpus: normal size, contour, position, consistency, mobility, non-tender  Adnexa:  no mass, fullness, tenderness  Rectal Exam: Not performed.        Assessment:    Normal postpartum exam. Pap smear done at today's visit.   Plan:   Essential components of care per ACOG recommendations:  1.  Mood and well being: Patient with negative depression screening today. Reviewed local resources for support.  - Patient does not use tobacco nor IV durgs  2. Infant care and feeding:  -Patient currently  breastmilk feeding?   3. Sexuality, contraception and birth spacing - Patient does not want a pregnancy in the next year.  Desired family size is unsure children.  - Reviewed forms of contraception in tiered fashion. Patient desired oral progesterone-only contraceptive today.  Pt considering IUD.  CHTN and age 58 is relative contraindation to combination OCPs.  - Discussed birth spacing of 18 months  4. Sleep and fatigue -Encouraged family/partner/community support of 4 hrs of uninterrupted sleep to help with mood and fatigue  5. Physical Recovery  - Discussed patients delivery and complications - Patient had a 2nd degree laceration, perineal healing reviewed. Patient expressed understanding - Patient has urinary incontinence? No - Patient is safe to resume physical and sexual activity  6.  Health Maintenance - Last pap smear done 2020 (+HPV, nml cytology; history of in situ cervical cancer) Gyn Onc recommend hysterectomy when done with childbearing.  Pap smear today. Mammogram ordered  7. CHTN: Chronic Disease - PCP follow up; Continue enalipril 20 mg.  Pt off HCTZ.  Pt was not GDM before pregnancy.  Will rpt glucola this year.  Pt checking fasting and some post prandial  Elsie Lincoln, MD

## 2019-08-04 LAB — CYTOLOGY - PAP
Comment: NEGATIVE
Diagnosis: NEGATIVE
High risk HPV: NEGATIVE

## 2019-08-17 ENCOUNTER — Ambulatory Visit (INDEPENDENT_AMBULATORY_CARE_PROVIDER_SITE_OTHER): Payer: BC Managed Care – PPO | Admitting: Certified Nurse Midwife

## 2019-08-17 ENCOUNTER — Encounter: Payer: Self-pay | Admitting: Certified Nurse Midwife

## 2019-08-17 ENCOUNTER — Other Ambulatory Visit: Payer: Self-pay

## 2019-08-17 VITALS — BP 143/88 | HR 75 | Ht 65.0 in | Wt 194.0 lb

## 2019-08-17 DIAGNOSIS — Z30430 Encounter for insertion of intrauterine contraceptive device: Secondary | ICD-10-CM

## 2019-08-17 DIAGNOSIS — Z3202 Encounter for pregnancy test, result negative: Secondary | ICD-10-CM | POA: Diagnosis not present

## 2019-08-17 LAB — POCT URINE PREGNANCY: Preg Test, Ur: NEGATIVE

## 2019-08-17 MED ORDER — LEVONORGESTREL 20 MCG/24HR IU IUD: INTRAUTERINE_SYSTEM | Freq: Once | INTRAUTERINE | Status: AC

## 2019-08-17 NOTE — Progress Notes (Signed)
   GYNECOLOGY CLINIC PROCEDURE NOTE  Ms. Rachael Dickerson is a 39 y.o. G1P1001 here for Mirena IUD insertion. No GYN concerns.  Last pap smear was on 08/02/19 and was normal.  IUD Insertion Procedure Note Patient identified, informed consent performed.  Discussed risks of irregular bleeding, cramping, infection, malpositioning or misplacement of the IUD outside the uterus which may require further procedure such as laparoscopy. Time out was performed.  Urine pregnancy test negative.  Speculum placed in the vagina.  Cervix visualized.  Cleaned with Betadine x 3.  Grasped anteriorly with a single tooth tenaculum.  Uterus sounded to 7.5 cm.  Mirena IUD placed per manufacturer's recommendations.  Strings trimmed to 3 cm. Tenaculum was removed, good hemostasis noted.  Patient tolerated procedure well.   Patient was given post-procedure instructions.  She was advised to be have backup contraception for one week.  Patient was also asked to check IUD strings periodically and follow up in 4 weeks for IUD check.  Sharyon Cable, CNM 08/17/2019 3:44 PM

## 2019-08-17 NOTE — Patient Instructions (Signed)

## 2019-08-18 ENCOUNTER — Ambulatory Visit (INDEPENDENT_AMBULATORY_CARE_PROVIDER_SITE_OTHER): Payer: BC Managed Care – PPO

## 2019-08-18 DIAGNOSIS — Z1231 Encounter for screening mammogram for malignant neoplasm of breast: Secondary | ICD-10-CM

## 2019-09-16 ENCOUNTER — Other Ambulatory Visit: Payer: Self-pay

## 2019-09-16 ENCOUNTER — Encounter: Payer: Self-pay | Admitting: Obstetrics and Gynecology

## 2019-09-16 ENCOUNTER — Ambulatory Visit (INDEPENDENT_AMBULATORY_CARE_PROVIDER_SITE_OTHER): Payer: BC Managed Care – PPO | Admitting: Obstetrics and Gynecology

## 2019-09-16 VITALS — BP 132/81 | HR 67 | Resp 16 | Ht 65.0 in | Wt 192.0 lb

## 2019-09-16 DIAGNOSIS — Z30431 Encounter for routine checking of intrauterine contraceptive device: Secondary | ICD-10-CM | POA: Diagnosis not present

## 2019-09-16 NOTE — Progress Notes (Signed)
   IUD Insertion Follow Up   Patient is 39 y.o. G1P1001 who is here for string check/followup after insertion of Mirena IUD 4 weeks ago. She is doing well, had minor pain after procedure that is now resolved. She had minimal spotting after procedure. Denies pain or other issues.  BP 132/81   Pulse 67   Resp 16   Ht 5\' 5"  (1.651 m)   Wt 192 lb (87.1 kg)   BMI 31.95 kg/m   Gen: alert, oriented Abd: soft, non-tender GU: normal appearing vaginal mucosa, normal appearing cervix with purple strings protruding 3 cm from cervix. Strings not trimmed.    Normal post IUD insertion check. Return 1 year for annual or prn    K. , M.D. [facility] [employer])

## 2019-10-06 ENCOUNTER — Telehealth: Payer: Self-pay | Admitting: *Deleted

## 2019-10-06 DIAGNOSIS — O165 Unspecified maternal hypertension, complicating the puerperium: Secondary | ICD-10-CM

## 2019-10-06 MED ORDER — ENALAPRIL MALEATE 20 MG PO TABS: 20.0000 mg | ORAL_TABLET | Freq: Every day | ORAL | 3 refills | Status: AC

## 2019-10-06 NOTE — Telephone Encounter (Signed)
Pt's insurance is now requiring her to go through [facility] for long term med.  Her Enalapril 20 mg was sent to [facility].  90 day supply.  Her PCP is to be taking over her care of hypertension.

## 2020-07-26 ENCOUNTER — Other Ambulatory Visit (HOSPITAL_BASED_OUTPATIENT_CLINIC_OR_DEPARTMENT_OTHER): Payer: Self-pay | Admitting: Physician Assistant

## 2020-07-26 DIAGNOSIS — Z1231 Encounter for screening mammogram for malignant neoplasm of breast: Secondary | ICD-10-CM

## 2020-08-14 ENCOUNTER — Encounter (HOSPITAL_BASED_OUTPATIENT_CLINIC_OR_DEPARTMENT_OTHER): Payer: BC Managed Care – PPO

## 2020-08-14 ENCOUNTER — Ambulatory Visit: Payer: BC Managed Care – PPO | Admitting: Obstetrics & Gynecology

## 2020-08-14 DIAGNOSIS — Z1231 Encounter for screening mammogram for malignant neoplasm of breast: Secondary | ICD-10-CM

## 2020-08-16 ENCOUNTER — Other Ambulatory Visit: Payer: Self-pay

## 2020-08-16 ENCOUNTER — Other Ambulatory Visit (HOSPITAL_COMMUNITY)
Admission: RE | Admit: 2020-08-16 | Discharge: 2020-08-16 | Disposition: A | Discharge status: Home / Self Care | Payer: Managed Care, Other (non HMO) | Source: Ambulatory Visit | Attending: Obstetrics & Gynecology | Admitting: Obstetrics & Gynecology

## 2020-08-16 ENCOUNTER — Other Ambulatory Visit (HOSPITAL_COMMUNITY)
Admission: RE | Admit: 2020-08-16 | Discharge: 2020-08-16 | Disposition: A | Discharge status: Home / Self Care | Payer: BC Managed Care – PPO | Source: Ambulatory Visit | Attending: Obstetrics & Gynecology | Admitting: Obstetrics & Gynecology

## 2020-08-16 ENCOUNTER — Encounter: Payer: Self-pay | Admitting: Obstetrics & Gynecology

## 2020-08-16 ENCOUNTER — Ambulatory Visit (INDEPENDENT_AMBULATORY_CARE_PROVIDER_SITE_OTHER): Payer: BC Managed Care – PPO | Admitting: Obstetrics & Gynecology

## 2020-08-16 VITALS — BP 132/90 | HR 99 | Resp 16 | Ht 65.0 in | Wt 192.0 lb

## 2020-08-16 DIAGNOSIS — Z01419 Encounter for gynecological examination (general) (routine) without abnormal findings: Secondary | ICD-10-CM

## 2020-08-16 DIAGNOSIS — Z1231 Encounter for screening mammogram for malignant neoplasm of breast: Secondary | ICD-10-CM

## 2020-08-16 DIAGNOSIS — Z8541 Personal history of malignant neoplasm of cervix uteri: Secondary | ICD-10-CM | POA: Diagnosis not present

## 2020-08-16 NOTE — Progress Notes (Signed)
Subjective:     Rachael Dickerson is a 40 y.o. female here for a routine exam.  Current complaints: none.  Thinking of becoming pregnant again.  Not ready right now.  Her son is 34 months old.  [place] recently [place] on [address].  It only has 3 bedrooms and they are also taking care of her stepson.  She has 2 embryos left.  She will consider it next year.   Gynecologic History No LMP recorded. (Menstrual status: IUD). Contraception: IUD Last Pap: 2021 (Hx of AIS). Results were: normal Last mammogram: 2021. Results were: normal  Obstetric History OB History  Gravida Para Term Preterm AB Living  1 1 1  0 0 1  SAB IAB Ectopic Multiple Live Births  0 0 0 0 1    # Outcome Date GA Lbr Len/2nd Weight Sex Delivery Anes PTL Lv  1 Term 07/02/19 [redacted]w[redacted]d 43:48 / 02:32 6 lb 4.4 oz (2.846 kg) M Vag-Vacuum EPI  LIV     The following portions of the patient's history were reviewed and updated as appropriate: allergies, current medications, past family history, past medical history, past social history, past surgical history and problem list.  Review of Systems Pertinent items noted in HPI and remainder of comprehensive ROS otherwise negative.    Objective:      Vitals:   08/16/20 0927  BP: 132/90  Pulse: 99  Resp: 16  Weight: 192 lb (87.1 kg)  Height: 5\' 5"  (1.651 m)   Vitals:  WNL General appearance: alert, cooperative and no distress  HEENT: Normocephalic, without obvious abnormality, atraumatic Eyes: negative Throat: lips, mucosa, and tongue normal; teeth and gums normal  Respiratory: Clear to auscultation bilaterally  CV: Regular rate and rhythm  Breasts:  Normal appearance, no masses or tenderness, no nipple retraction or dimpling; breast tissue is dense  GI: Soft, non-tender; bowel sounds normal; no masses,  no organomegaly  GU: External Genitalia:  Tanner V, no lesion Urethra:  No prolapse   Vagina: Pink, normal rugae, no blood or discharge  Cervix: No CMT,  no lesion, IUD stsrings seen; + bleeding after pap  Uterus:  Normal size and contour, non tender  Adnexa: Normal, no masses, non tender  Musculoskeletal: No edema, redness or tenderness in the calves or thighs  Skin: No lesions or rash  Lymphatic: Axillary adenopathy: none     Psychiatric: Normal mood and behavior        Assessment:    Healthy female exam.   History of AIS who wants to retain fertility   Plan:    1.  Pap with co testing and ECC 2.  Continue IUD 3.  Mammograms 4.  If patient is thinking becoming pregnant, I suggest she make a appointment with her REI to determine IUD removal timing.

## 2020-08-17 LAB — SURGICAL PATHOLOGY

## 2020-08-18 LAB — CYTOLOGY - PAP
Comment: NEGATIVE
Diagnosis: UNDETERMINED — AB
High risk HPV: NEGATIVE

## 2020-09-06 ENCOUNTER — Other Ambulatory Visit (HOSPITAL_BASED_OUTPATIENT_CLINIC_OR_DEPARTMENT_OTHER): Payer: Self-pay | Admitting: Physician Assistant

## 2020-09-06 DIAGNOSIS — Z1231 Encounter for screening mammogram for malignant neoplasm of breast: Secondary | ICD-10-CM

## 2020-09-18 ENCOUNTER — Ambulatory Visit (INDEPENDENT_AMBULATORY_CARE_PROVIDER_SITE_OTHER): Payer: BC Managed Care – PPO | Admitting: Obstetrics & Gynecology

## 2020-09-18 ENCOUNTER — Other Ambulatory Visit: Payer: Self-pay

## 2020-09-18 ENCOUNTER — Ambulatory Visit (HOSPITAL_BASED_OUTPATIENT_CLINIC_OR_DEPARTMENT_OTHER)
Admission: RE | Admit: 2020-09-18 | Discharge: 2020-09-18 | Disposition: A | Discharge status: Home / Self Care | Payer: BC Managed Care – PPO | Source: Ambulatory Visit | Attending: Physician Assistant | Admitting: Physician Assistant

## 2020-09-18 ENCOUNTER — Encounter (HOSPITAL_BASED_OUTPATIENT_CLINIC_OR_DEPARTMENT_OTHER): Payer: Self-pay

## 2020-09-18 ENCOUNTER — Other Ambulatory Visit (HOSPITAL_COMMUNITY)
Admission: RE | Admit: 2020-09-18 | Discharge: 2020-09-18 | Disposition: A | Discharge status: Home / Self Care | Payer: BC Managed Care – PPO | Source: Ambulatory Visit | Attending: Obstetrics & Gynecology | Admitting: Obstetrics & Gynecology

## 2020-09-18 ENCOUNTER — Encounter: Payer: Self-pay | Admitting: Obstetrics & Gynecology

## 2020-09-18 VITALS — BP 134/81 | HR 104 | Resp 16 | Ht 65.0 in | Wt 192.0 lb

## 2020-09-18 DIAGNOSIS — R87610 Atypical squamous cells of undetermined significance on cytologic smear of cervix (ASC-US): Secondary | ICD-10-CM | POA: Insufficient documentation

## 2020-09-18 DIAGNOSIS — Z1231 Encounter for screening mammogram for malignant neoplasm of breast: Secondary | ICD-10-CM | POA: Diagnosis present

## 2020-09-18 DIAGNOSIS — Z01812 Encounter for preprocedural laboratory examination: Secondary | ICD-10-CM

## 2020-09-18 LAB — POCT URINE PREGNANCY: Preg Test, Ur: NEGATIVE

## 2020-09-18 NOTE — Progress Notes (Signed)
   Subjective:    Patient ID: Rachael Dickerson, female    DOB: 1980-06-20, 40 y.o.   MRN: [id number]  HPI  40 yo female presents for colposcopy for ACUS and AGC NOS.  See separate colposcopy note for details.   Pt has IUD for a year and has 2.5 weeks of brown spotting followed by about 3-4 weeks of no bleeding.  This has occurred for most months.  Pt denies pain.    Review of Systems  Constitutional: Negative.   Respiratory: Negative.   Cardiovascular: Negative.   Gastrointestinal: Negative.   Genitourinary: Positive for vaginal bleeding.       Objective:   Physical Exam Vitals reviewed.  Constitutional:      General: She is not in acute distress.    Appearance: She is well-developed.  HENT:     Head: Normocephalic and atraumatic.  Eyes:     Conjunctiva/sclera: Conjunctivae normal.  Cardiovascular:     Rate and Rhythm: Normal rate.  Pulmonary:     Effort: Pulmonary effort is normal.  Genitourinary:    General: Normal vulva.     Comments: IUD strings 3-4 cm lapping over cervix. Skin:    General: Skin is warm and dry.  Neurological:     Mental Status: She is alert and oriented to person, place, and time.    Vitals:   09/18/20 1018  BP: 134/81  Pulse: (!) 104  Resp: 16  Weight: 192 lb (87.1 kg)  Height: 5\' 5"  (1.651 m)      Assessment & Plan:  40 yo female with progersterone secreting IUD with spotting  1.  TUVS to eval lining and placement of IUD 2.  Colpo today for abml pap.

## 2020-09-18 NOTE — Progress Notes (Signed)
Colposcopy Procedure Note  Indications: Pap smear 1 months ago showed: ASCUS and AGUS with HPV negative. The prior pap showed no abnormalities.  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  Procedure Details  The risks and benefits of the procedure and Written informed consent obtained.  Speculum placed in vagina and excellent visualization of cervix achieved, cervix swabbed x 3 with acetic acid solution.  Findings: Cervix: visible lesion(s) at 4 o'clock; with acetowhite at 9-10 o'clock cervix swabbed with Lugol's solution and SCJ visualized - lesion at 4 o'clock.  IUD strings seen Vaginal inspection: vaginal colposcopy not performed. Vulvar colposcopy: vulvar colposcopy not performed.  Specimens: biopsies at 3 & 4 o'clock (one vial); 9 & 10 o'clock (2nd vial); vigorous ECC (3rd vial)  Complications: none.  Plan: Specimens labelled and sent to Pathology. Will base further treatment on Pathology findings. Post biopsy instructions given to patient. Will refer to gyn onc for consult.  Pt interested in having one more child.

## 2020-09-19 NOTE — Progress Notes (Signed)
Normal mammogram

## 2020-09-25 LAB — SURGICAL PATHOLOGY

## 2020-12-09 IMAGING — US US MFM FETAL BPP W/O NON-STRESS
1 series · 12 of 28 positions shown · non-contrast
Comparison: none

[Series 1: us mfm fetal bpp w/o non-stress · 31 acquisitions, 12 frames shown]
[im 2/31]
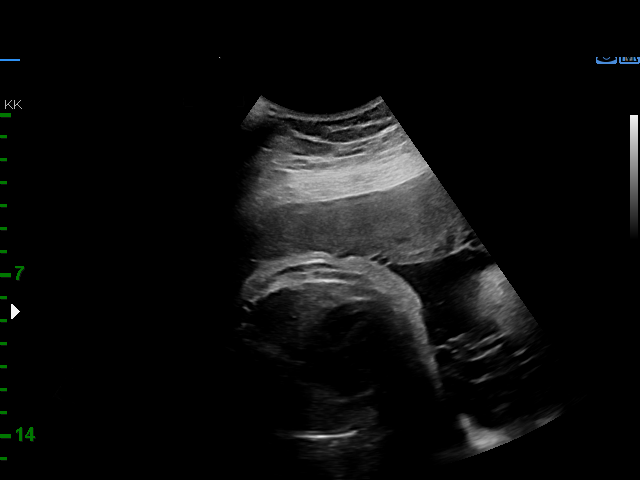
[im 4/31]
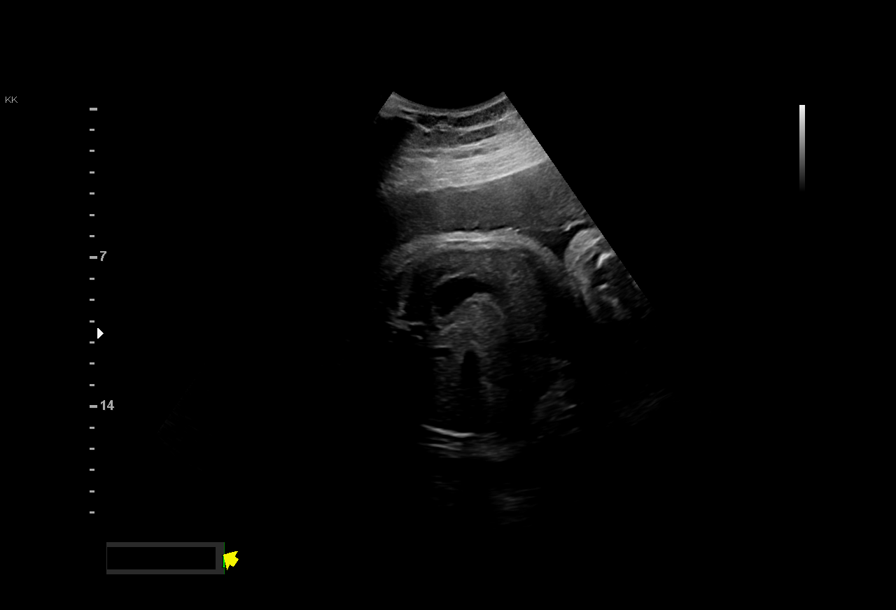
[im 6/31]
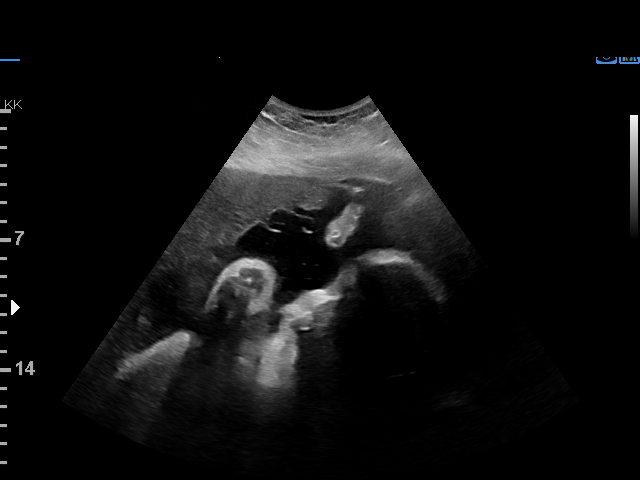
[im 9/31]
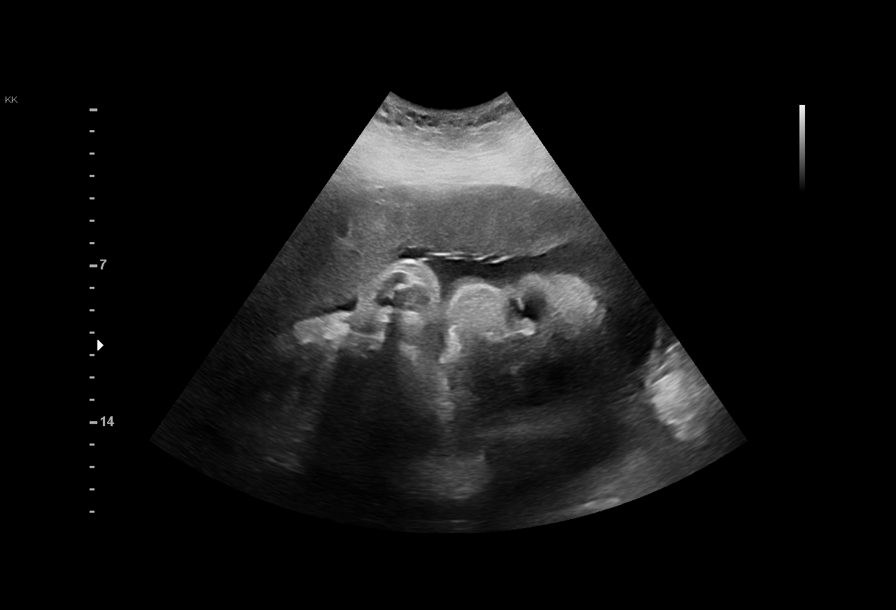
[im 12/31]
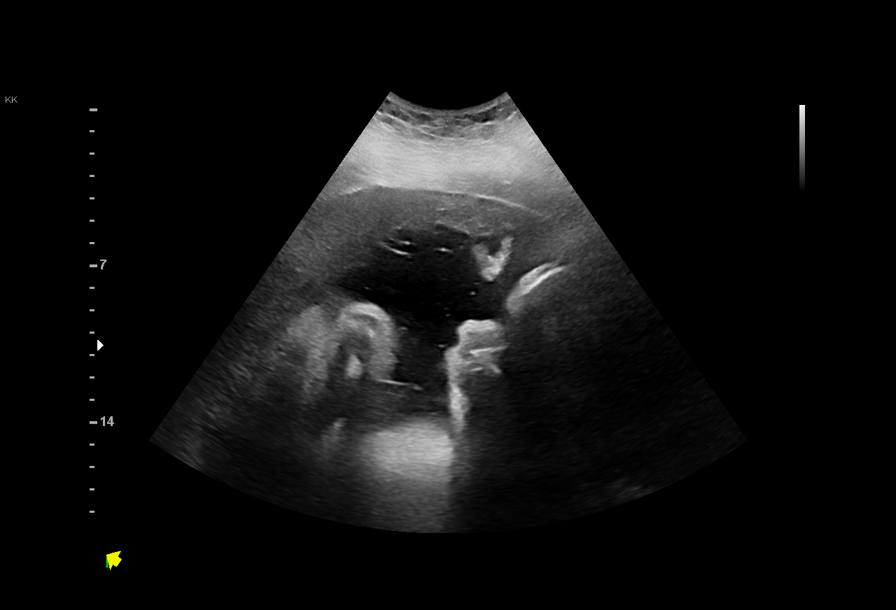
[im 14/31]
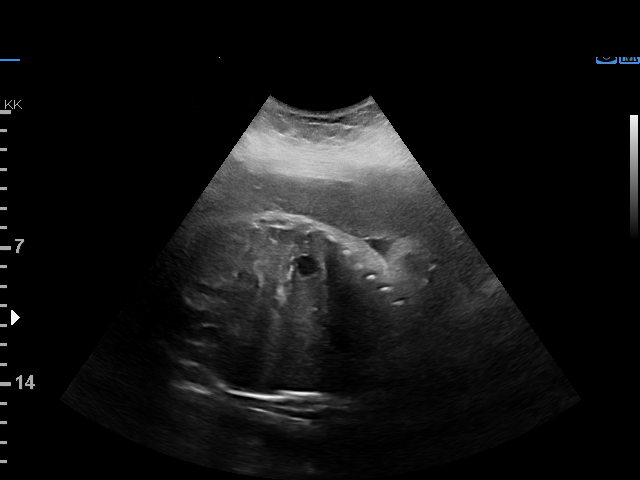
[im 17/31]
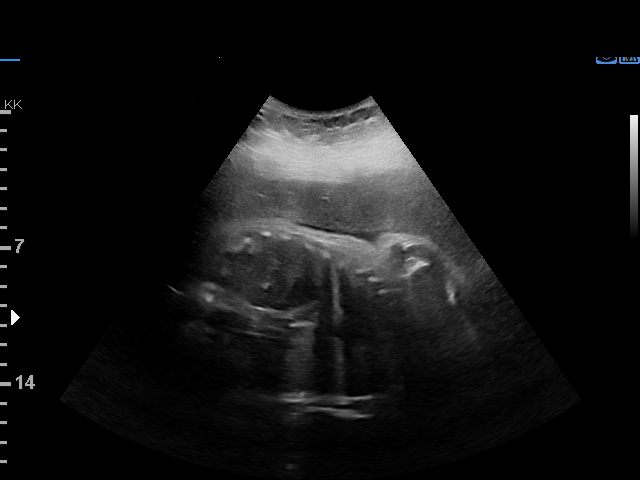
[im 19/31]
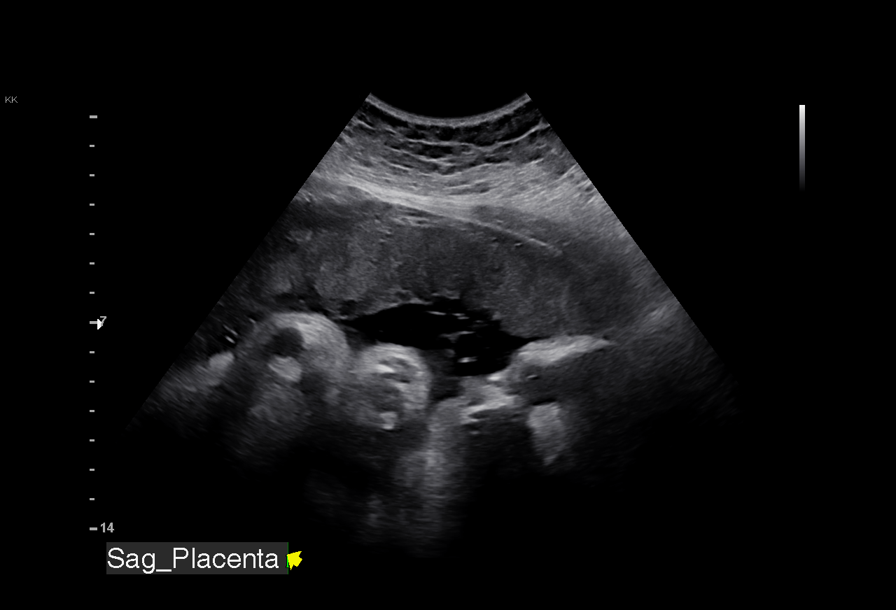
[im 22/31]
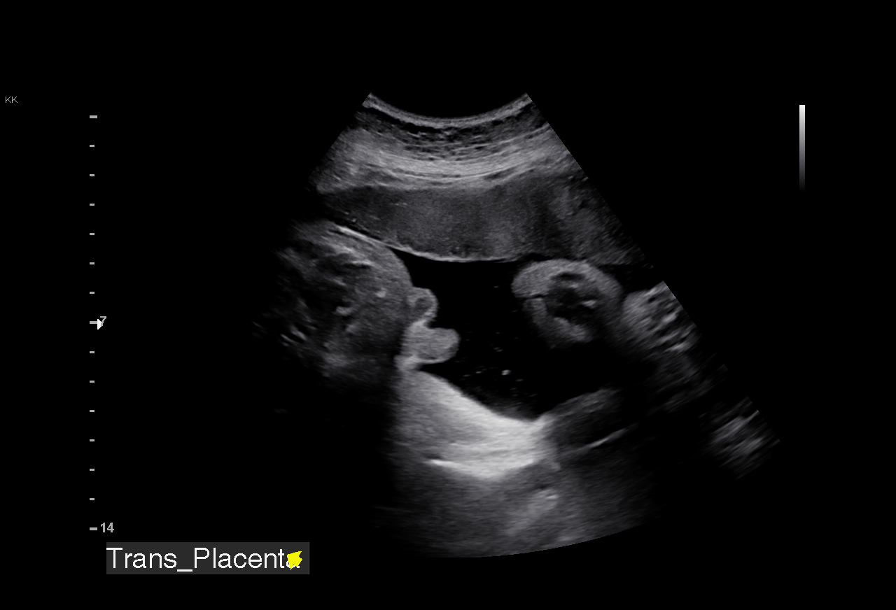
[im 25/31]
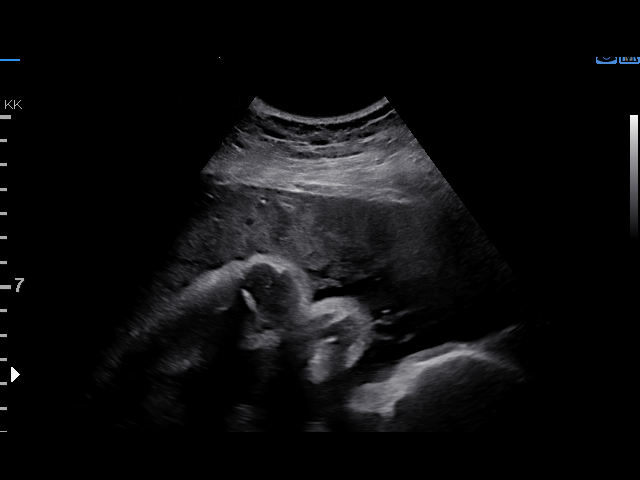
[im 27/31]
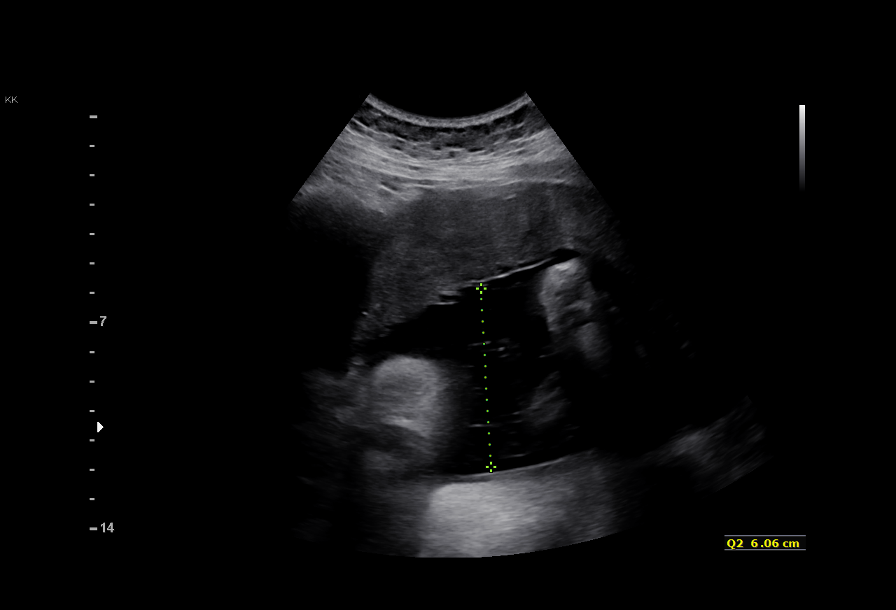
[im 29/31]
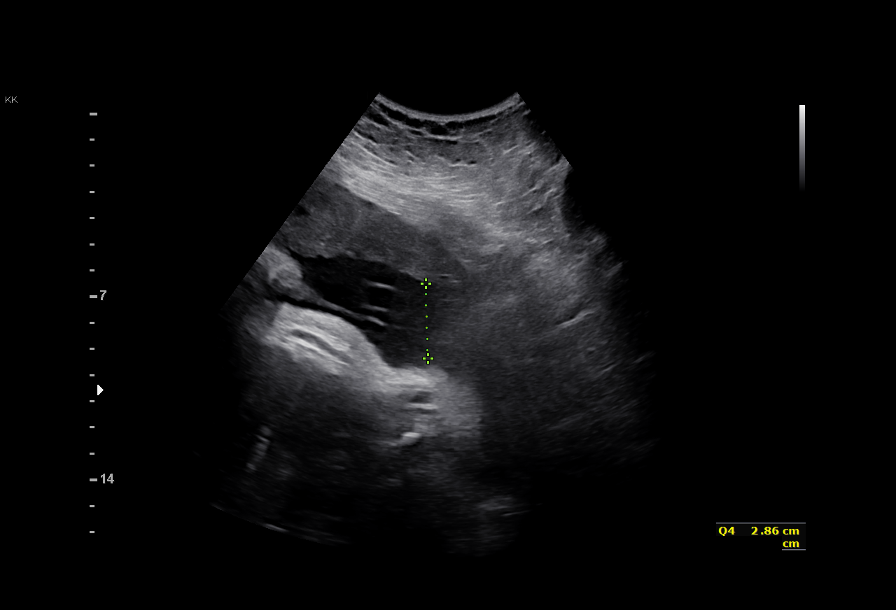

[12 of 28 positions shown; findings below may reference images not displayed]

STRESS                                            YOUNG-D
 ----------------------------------------------------------------------

 ----------------------------------------------------------------------
Indications

  37 weeks gestation of pregnancy
  Gestational diabetes in pregnancy,
  controlled by oral hypoglycemic drugs
  (metformin)
  Hypertension - Chronic/Pre-existing
  (labetalol)
  Advanced maternal age primigravida 35+,
  third trimester (normal pre transfer genetic
  testing)
  Medical complication of pregnancy
  (adenocarinoma in Situ of cervix)
  Pregnancy resulting from assisted
  reproductive technology
 ----------------------------------------------------------------------
Fetal Evaluation

 Num Of Fetuses:         1
 Fetal Heart Rate(bpm):  140
 Cardiac Activity:       Observed
 Presentation:           Cephalic
 Placenta:               Anterior
 P. Cord Insertion:      Previously Visualized

 Amniotic Fluid
 AFI FV:      Within normal limits

 AFI Sum(cm)     %Tile       Largest Pocket(cm)
 16.83           64

 RUQ(cm)       RLQ(cm)       LUQ(cm)        LLQ(cm)

Biophysical Evaluation

 Amniotic F.V:   Pocket => 2 cm             F. Tone:        Observed
 F. Movement:    Observed                   Score:          [DATE]
 F. Breathing:   Observed
OB History

 Gravidity:    1         Term:   0        Prem:   0        SAB:   0
 TOP:          0       Ectopic:  0        Living: 0
Gestational Age

 LMP:           37w 0d        Date:  10/07/18                 EDD:   07/14/19
 Best:          37w 0d     Det. By:  LMP  (10/07/18)          EDD:   07/14/19
Impression

 Chronic hypertension.  Well controlled on labetalol.  Blood
 pressure at your office at prenatal visit was 130/78 mmHg.
 Gestational diabetes.  Well controlled on Metformin.

 Amniotic fluid is normal and good fetal activity is seen.
 Antenatal testing is reassuring. BPP [DATE].

 Patient reports she will be undergoing induction of labor next
 week.
Recommendations

 Follow-up scans as clinically indicated.
                 Aujla, Caleb

## 2021-02-21 DIAGNOSIS — J343 Hypertrophy of nasal turbinates: Secondary | ICD-10-CM | POA: Insufficient documentation

## 2021-02-21 DIAGNOSIS — H6983 Other specified disorders of Eustachian tube, bilateral: Secondary | ICD-10-CM | POA: Insufficient documentation

## 2021-02-21 DIAGNOSIS — H6993 Unspecified Eustachian tube disorder, bilateral: Secondary | ICD-10-CM | POA: Insufficient documentation

## 2021-02-21 DIAGNOSIS — J309 Allergic rhinitis, unspecified: Secondary | ICD-10-CM | POA: Insufficient documentation

## 2021-03-01 DIAGNOSIS — F17200 Nicotine dependence, unspecified, uncomplicated: Secondary | ICD-10-CM | POA: Insufficient documentation

## 2021-04-15 HISTORY — PX: BREAST BIOPSY: SHX20

## 2021-04-30 ENCOUNTER — Other Ambulatory Visit (HOSPITAL_COMMUNITY)
Admission: RE | Admit: 2021-04-30 | Discharge: 2021-04-30 | Disposition: A | Discharge status: Home / Self Care | Payer: BC Managed Care – PPO | Source: Ambulatory Visit | Attending: Family Medicine | Admitting: Family Medicine

## 2021-04-30 ENCOUNTER — Other Ambulatory Visit: Payer: Self-pay

## 2021-04-30 ENCOUNTER — Encounter: Payer: Self-pay | Admitting: Family Medicine

## 2021-04-30 ENCOUNTER — Ambulatory Visit (INDEPENDENT_AMBULATORY_CARE_PROVIDER_SITE_OTHER): Payer: BC Managed Care – PPO | Admitting: Family Medicine

## 2021-04-30 VITALS — BP 140/82 | HR 98 | Resp 16 | Ht 65.0 in | Wt 189.0 lb

## 2021-04-30 DIAGNOSIS — Z01812 Encounter for preprocedural laboratory examination: Secondary | ICD-10-CM | POA: Diagnosis not present

## 2021-04-30 DIAGNOSIS — R87619 Unspecified abnormal cytological findings in specimens from cervix uteri: Secondary | ICD-10-CM

## 2021-04-30 DIAGNOSIS — R87610 Atypical squamous cells of undetermined significance on cytologic smear of cervix (ASC-US): Secondary | ICD-10-CM | POA: Diagnosis not present

## 2021-04-30 LAB — POCT URINE PREGNANCY: Preg Test, Ur: NEGATIVE

## 2021-04-30 NOTE — Progress Notes (Signed)
° ° °  GYNECOLOGY OFFICE COLPOSCOPY PROCEDURE NOTE  41 y.o. G1P1001 here for colposcopy for ASCUS with NEGATIVE high risk HPV, AGUS pap smear on 08/16/2020. Colposcopy and ECC at that time were notable for CIN1 on 2 biopsies with negative ECC. H/o [facility] due to  adenocarcinoma in situ 10 years ago. Discussed role for HPV in cervical dysplasia, need for surveillance. Here for repeat pap and colpo.  Patient gave informed written consent, time out was performed.  Placed in lithotomy position. Cervix viewed with speculum and colposcope after application of acetic acid. IUD strings present.  Colposcopy adequate? No borders of lesions not seen Acetowhite lesion(s) noted at entire SCJ 4 quadrant biopsies obtained.  ECC specimen obtained. All specimens were labeled and sent to pathology.  Patient was given post procedure instructions.  Will follow up pathology and manage accordingly; patient will be contacted with results and recommendations.  Routine preventative health maintenance measures emphasized. If continued AGUS, could consider EMB though patient has progestin containing IUD.  Reva Bores, MD 04/30/2021 9:46 AM

## 2021-05-01 LAB — SURGICAL PATHOLOGY

## 2021-05-02 LAB — CYTOLOGY - PAP
Comment: NEGATIVE
High risk HPV: NEGATIVE

## 2021-07-10 ENCOUNTER — Encounter: Payer: Self-pay | Admitting: Obstetrics & Gynecology

## 2021-09-13 NOTE — Progress Notes (Addendum)
GYNECOLOGY OFFICE VISIT NOTE  History:   Rachael Dickerson is a 41 y.o. G1P1001 here today for discussion regarding hysterectomy. She has a history in 2012 of AIS. She had a [facility] at the time and had negative margins per review of notes (I do not have pathology available).   Pap History since that time 2012: [facility] for AIS - this was done by a doctor in [place] (where she lived at the time.  2015: ECC negative 2016: HSIL, ECC negative 2017: Normal pap > ECC negative for dysplasia but hyperplasia noted 06/2018: Pap wnl, HPV pos, ECC overall benign 07/2019: Pap/HPV wnl 08/2020: ASCUS/HPV neg, AGUS/NOS, ECC - scanty endocervical tissue that was benign 09/2020: CIN1 on Bx, Adenosis/hyperplasia of endocervical glands on ECC. Negative p16, Ki67 c/w benign.  04/2021: LSIL, HPV neg, CIN1, negative ECC  She has an IUD currently for birth control - it is a Mirena placed in 2021. She does also have a h/o infertility for which she underwent IVF. She had one delivery, VAVD, in 2021. She still has two frozen embryos but does not wish to have them transferred. She and her husband have decided their family is complete and do not wish to have any more children.  She has the IUD to help with bleeding as well and thus far she just bleeds all the time on it.   She denies any abnormal vaginal discharge, bleeding, pelvic pain or other concerns.     Past Medical History:  Diagnosis Date   Adenocarcinoma in situ of cervix 11/22/2015   2012. Last pap was 11/16/2015 and cells were normal.    Benign cyst of breast 11/22/2015   Essential hypertension, benign 11/22/2015   Gestational diabetes    Hypertension    Insulin resistance 11/22/2015   Obesity 11/22/2015   Vaginal Pap smear, abnormal     Past Surgical History:  Procedure Laterality Date   CERVICAL CONE BIOPSY  06/2010   ivf     TONSILLECTOMY      The following portions of the patient's history were reviewed and updated as appropriate: allergies, current  medications, past family history, past medical history, past social history, past surgical history and problem list.   Health Maintenance:    Diagnosis  Date Value Ref Range Status  04/30/2021 - Low grade squamous intraepithelial lesion (LSIL) (A)  Final   HPV negative.  See pap details above   Normal mammogram on 09/19/2020.   Review of Systems:  Pertinent items noted in HPI and remainder of comprehensive ROS otherwise negative.  Physical Exam:  BP (!) 145/89   Pulse (!) 102   Resp 16   Ht 5' 5"  (1.651 m)   Wt 199 lb (90.3 kg)   BMI 33.12 kg/m  CONSTITUTIONAL: Well-developed, well-nourished female in no acute distress.  HEENT:  Normocephalic, atraumatic. External right and left ear normal. No scleral icterus.  NECK: Normal range of motion, supple, no masses noted on observation SKIN: No rash noted. Not diaphoretic. No erythema. No pallor. MUSCULOSKELETAL: Normal range of motion. No edema noted. NEUROLOGIC: Alert and oriented to person, place, and time. Normal muscle tone coordination. No cranial nerve deficit noted. PSYCHIATRIC: Normal mood and affect. Normal behavior. Normal judgment and thought content.  CARDIOVASCULAR: Normal heart rate noted RESPIRATORY: Effort and breath sounds normal, no problems with respiration noted ABDOMEN: No masses noted. No other overt distention noted.    PELVIC: Normal appearing external genitalia; normal urethral meatus; normal appearing vaginal mucosa and cervix.  IUD  strings visible No abnormal discharge noted.  Normal uterine size, no other palpable masses, no uterine or adnexal tenderness. Performed in the presence of a chaperone  Labs and Imaging Results for orders placed or performed in visit on 09/17/21 (from the past 168 hour(s))  POCT urine pregnancy   Collection Time: 09/17/21  2:42 PM  Result Value Ref Range   Preg Test, Ur Negative Negative   No results found.    ENDOMETRIAL BIOPSY AND ENDOCERVICAL CURETTING The indications  for endometrial biopsy and ECC were reviewed.   Risks of the biopsy including cramping, bleeding, infection, uterine perforation, inadequate specimen and need for additional procedures were discussed. Offered alternative of hysteroscopy, dilation and curettage in OR. The patient states she understands the R/B/I/A and agrees to undergo procedure today. Urine pregnancy test was Negative. Consent was signed. Time out was performed.    Patient was positioned in dorsal lithotomy position. A vaginal speculum was placed. The cervix was visualized and an ECC was performed. The cervix was then prepped with Betadine.  A single-toothed tenaculum was placed on the anterior lip of the cervix to stabilize it. The 3 mm pipelle was easily introduced into the endometrial cavity without difficulty and a Moderate amount of tissue was obtained after two passes and sent to pathology. The instruments were removed from the patient's vagina. Minimal bleeding from the cervix was noted. The patient tolerated the procedure well.   Patient was given post procedure instructions.  Will follow up pathology and manage accordingly; patient will be contacted with results and recommendations.  Routine preventative health maintenance measures emphasized. Assessment and Plan:   1. History of cervical cancer (Adenocarcinoma in situ) - Discussed hysterectomy and indication.  - EMB done today along with ECC (last ECC was not quite sufficient) - Risks of surgery include but are not limited to: bleeding, infection, injury to surrounding organs/tissues (i.e. bowel/bladder/ureters), need for additional procedures, wound complications, hospital re-admission, and conversion to open surgery - We discussed postop restrictions, precautions and expectations. Discussed additional restrictions may exist for bladder sling surgery.  - Discussed appointment with Dr. Wannetta Sender. Discussed Dr. Wannetta Sender may be able to do all her surgery vs a combine case if  easier for pt and Dr. Wannetta Sender.  -     POCT urine pregnancy -     Surgical pathology( [place]) -     Ambulatory referral to Urogynecology  Routine preventative health maintenance measures emphasized. Please refer to After Visit Summary for other counseling recommendations.   No follow-ups on file.  Radene Gunning, MD, [place] [facility], [place]

## 2021-09-17 ENCOUNTER — Other Ambulatory Visit (HOSPITAL_COMMUNITY)
Admission: RE | Admit: 2021-09-17 | Discharge: 2021-09-17 | Disposition: A | Discharge status: Home / Self Care | Payer: BC Managed Care – PPO | Source: Ambulatory Visit | Attending: Obstetrics and Gynecology | Admitting: Obstetrics and Gynecology

## 2021-09-17 ENCOUNTER — Encounter: Payer: Self-pay | Admitting: Obstetrics and Gynecology

## 2021-09-17 ENCOUNTER — Ambulatory Visit (INDEPENDENT_AMBULATORY_CARE_PROVIDER_SITE_OTHER): Payer: BC Managed Care – PPO | Admitting: Obstetrics and Gynecology

## 2021-09-17 VITALS — BP 145/89 | HR 102 | Resp 16 | Ht 65.0 in | Wt 199.0 lb

## 2021-09-17 DIAGNOSIS — Z8541 Personal history of malignant neoplasm of cervix uteri: Secondary | ICD-10-CM | POA: Diagnosis present

## 2021-09-17 DIAGNOSIS — Z01812 Encounter for preprocedural laboratory examination: Secondary | ICD-10-CM

## 2021-09-17 DIAGNOSIS — Z30431 Encounter for routine checking of intrauterine contraceptive device: Secondary | ICD-10-CM | POA: Diagnosis not present

## 2021-09-17 DIAGNOSIS — N393 Stress incontinence (female) (male): Secondary | ICD-10-CM

## 2021-09-17 LAB — POCT URINE PREGNANCY: Preg Test, Ur: NEGATIVE

## 2021-09-19 LAB — SURGICAL PATHOLOGY

## 2021-09-24 ENCOUNTER — Encounter: Payer: Self-pay | Admitting: Obstetrics and Gynecology

## 2021-09-26 ENCOUNTER — Other Ambulatory Visit (HOSPITAL_COMMUNITY)
Admission: RE | Admit: 2021-09-26 | Discharge: 2021-09-26 | Disposition: A | Discharge status: Home / Self Care | Payer: BC Managed Care – PPO | Attending: Obstetrics and Gynecology | Admitting: Obstetrics and Gynecology

## 2021-09-26 ENCOUNTER — Ambulatory Visit (INDEPENDENT_AMBULATORY_CARE_PROVIDER_SITE_OTHER): Payer: BC Managed Care – PPO | Admitting: Obstetrics and Gynecology

## 2021-09-26 ENCOUNTER — Encounter: Payer: Self-pay | Admitting: Obstetrics and Gynecology

## 2021-09-26 VITALS — BP 151/92 | HR 109 | Ht 63.0 in | Wt 199.0 lb

## 2021-09-26 DIAGNOSIS — R82998 Other abnormal findings in urine: Secondary | ICD-10-CM

## 2021-09-26 DIAGNOSIS — N393 Stress incontinence (female) (male): Secondary | ICD-10-CM

## 2021-09-26 DIAGNOSIS — Z8541 Personal history of malignant neoplasm of cervix uteri: Secondary | ICD-10-CM | POA: Diagnosis not present

## 2021-09-26 DIAGNOSIS — R350 Frequency of micturition: Secondary | ICD-10-CM | POA: Diagnosis not present

## 2021-09-26 LAB — URINALYSIS, ROUTINE W REFLEX MICROSCOPIC
Bilirubin Urine: NEGATIVE
Glucose, UA: 50 mg/dL — AB
Ketones, ur: NEGATIVE mg/dL
Nitrite: NEGATIVE
Protein, ur: NEGATIVE mg/dL
Specific Gravity, Urine: 1.015 (ref 1.005–1.030)
pH: 8 (ref 5.0–8.0)

## 2021-09-26 LAB — POCT URINALYSIS DIPSTICK
Appearance: NORMAL
Bilirubin, UA: NEGATIVE
Glucose, UA: NEGATIVE
Ketones, UA: NEGATIVE
Nitrite, UA: NEGATIVE
Protein, UA: NEGATIVE
Spec Grav, UA: 1.02 (ref 1.010–1.025)
Urobilinogen, UA: 1 E.U./dL
pH, UA: 8 (ref 5.0–8.0)

## 2021-09-26 NOTE — Progress Notes (Signed)
[facility] New Patient Evaluation and Consultation  Referring Provider: Radene Gunning, MD PCP: Lavada Mesi Date of Service: 09/26/2021  SUBJECTIVE Chief Complaint: New Patient (Initial Visit) Rachael Dickerson is a 41 y.o. female here for a consult on incontinence. Pt said she is having stress incontinence. Pt said she is looking to have a hysterectomy.)  History of Present Illness: Rachael Dickerson is a 41 y.o. White or Caucasian female seen in consultation at the request of Dr. Damita Dunnings for evaluation of incontinence.    Review of records from Dr Damita Dunnings significant for: Pap History since that time 2012: [facility] for AIS - this was done by a doctor in [place] (where she lived at the time.  2015: ECC negative 2016: HSIL, ECC negative 2017: Normal pap > ECC negative for dysplasia but hyperplasia noted 06/2018: Pap wnl, HPV pos, ECC overall benign 07/2019: Pap/HPV wnl 08/2020: ASCUS/HPV neg, AGUS/NOS, ECC - scanty endocervical tissue that was benign 09/2020: CIN1 on Bx, Adenosis/hyperplasia of endocervical glands on ECC. Negative p16, Ki67 c/w benign.  04/2021: LSIL, HPV neg, CIN1, negative ECC  Planning for a hysterectomy due to history of AIS and has completed childbearing. Having leakage and interested in concurrent sling.  Urinary Symptoms: Leaks urine with cough/ sneeze and with urgency. Urgency only happens if she holds it too long (only happens once every few weeks).  Leaks 1 time(s) per day.  Pad use: 2-3 liners/ mini-pads  She is bothered by her UI symptoms.  Day time voids 5-6.  Nocturia: 0 times per night to void. Voiding dysfunction: she empties her bladder well.  does not use a catheter to empty bladder.  When urinating, she feels dribbling after finishing Drinks: coffee, tea, soda, does not drink water  UTIs: 1 UTI's in the last year.   Denies history of blood in urine and kidney or bladder stones  Pelvic Organ Prolapse Symptoms:                   She Denies a feeling of a bulge the vaginal area.   Bowel Symptom: Bowel movements: 1 time(s) per day Stool consistency: hard Straining: no.  Splinting: no.  Incomplete evacuation: no.  She Denies accidental bowel leakage / fecal incontinence Bowel regimen: none  Sexual Function Sexually active: yes.  Sexual orientation: Straight Pain with sex: No  Pelvic Pain Denies pelvic pain   Past Medical History:  Past Medical History:  Diagnosis Date   Adenocarcinoma in situ of cervix 11/22/2015   2012. Last pap was 11/16/2015 and cells were normal.    Benign cyst of breast 11/22/2015   Essential hypertension, benign 11/22/2015   Gestational diabetes    Hypertension    Insulin resistance 11/22/2015   Obesity 11/22/2015   Vaginal Pap smear, abnormal      Past Surgical History:   Past Surgical History:  Procedure Laterality Date   CERVICAL CONE BIOPSY  06/2010   ivf     TONSILLECTOMY       Past OB/GYN History: OB History  Gravida Para Term Preterm AB Living  _0 0 0 1  SAB IAB Ectopic Multiple Live Births  0 0 0 0 1    # Outcome Date GA Lbr Len/2nd Weight Sex Delivery Anes PTL Lv  1 Term 07/02/19 [id number]:48 / 02:32 6 lb 4.4 oz (2.846 kg) M Vag-Vacuum EPI  LIV   Has mirena IUD- has frequent spotting    Medications: She has a current medication list  which includes the following prescription(s): enalapril, fluticasone, levocetirizine, and levonorgestrel.   Allergies: Patient is allergic to cranberry, erythromycin, and latex.   Social History:  Social History   Tobacco Use   Smoking status: Every Day    Packs/day: 1.00    Years: 20.00    Total pack years: 20.00    Types: Cigarettes   Smokeless tobacco: Never  Vaping Use   Vaping Use: Never used  Substance Use Topics   Alcohol use: Not Currently    Comment: socially   Drug use: No    Relationship status: married She lives with husband and children.   She is employed as a Research scientist (physical sciences). Regular exercise:  No History of abuse: No  Family History:   Family History  Problem Relation Age of Onset   Hypertension Mother    Cancer Maternal Grandmother        melanoma   Breast cancer Other        Great MGM     Review of Systems: Review of Systems  Constitutional:  Negative for fever, malaise/fatigue and weight loss.  Respiratory:  Negative for cough, shortness of breath and wheezing.   Cardiovascular:  Negative for chest pain, palpitations and leg swelling.  Gastrointestinal:  Negative for abdominal pain and blood in stool.  Genitourinary:  Negative for dysuria.  Musculoskeletal:  Negative for myalgias.  Skin:  Negative for rash.  Neurological:  Negative for dizziness and headaches.  Endo/Heme/Allergies:  Does not bruise/bleed easily.  Psychiatric/Behavioral:  Negative for depression. The patient is not nervous/anxious.      OBJECTIVE Physical Exam: Vitals:   09/26/21 1311  BP: (!) 151/92  Pulse: (!) 109  Weight: 199 lb (90.3 kg)  Height: 5' 3" (1.6 m)    Physical Exam Constitutional:      General: She is not in acute distress. Pulmonary:     Effort: Pulmonary effort is normal.  Abdominal:     General: There is no distension.     Palpations: Abdomen is soft.     Tenderness: There is no abdominal tenderness. There is no rebound.  Musculoskeletal:        General: No swelling. Normal range of motion.  Skin:    General: Skin is warm and dry.     Findings: No rash.  Neurological:     Mental Status: She is alert and oriented to person, place, and time.  Psychiatric:        Mood and Affect: Mood normal.        Behavior: Behavior normal.      GU / Detailed Urogynecologic Evaluation:  Pelvic Exam: Normal external female genitalia; Bartholin's and Skene's glands normal in appearance; urethral meatus normal in appearance, no urethral masses or discharge.   CST: negative  Speculum exam reveals normal vaginal mucosa without atrophy. Cervix normal appearance. Uterus normal  single, nontender. Adnexa no mass, fullness, tenderness.    Pelvic floor strength II/V  Pelvic floor musculature: Right levator non-tender, Right obturator non-tender, Left levator non-tender, Left obturator non-tender  POP-Q:   POP-Q  -2.5                                            Aa   -2.5  Ba  -8                                              C   3                                            Gh  4.5                                            Pb  9                                            tvl   -3                                            Ap  -3                                            Bp  -8.5                                              D     Rectal Exam:  Normal external rectum  Post-Void Residual (PVR) by Bladder Scan: In order to evaluate bladder emptying, we discussed obtaining a postvoid residual and she agreed to this procedure.  Procedure: The ultrasound unit was placed on the patient's abdomen in the suprapubic region after the patient had voided. A PVR of 8 ml was obtained by bladder scan.  Laboratory Results: POC urine: small blood and luekocytes  Verbal consent was obtained to perform simple CMG procedure:   Prolapse was reduced using 2 large cotton swabs. Urethra was prepped with betadine and a 69F catheter was placed and bladder was drained completely. The bladder was then backfilled with sterile water by gravity.  First sensation: 15m First Desire: 175mStrong Desire: 32070mapacity: 450m42mugh stress test was positive. Valsalva stress test was negative.  Catheter was inserted to drain bladder.   Interpretation: CMG showed normal sensation, and normal cystometric capacity. Findings positive for stress incontinence, negative for detrusor overactivity.    ASSESSMENT AND PLAN Ms. JernGlanzera 41 y61. with:  1. SUI (stress urinary incontinence, female)   2. History of cervical cancer  (Adenocarcinoma in situ)   3. Leukocytes in urine   4. Urinary frequency    SUI - For treatment of stress urinary incontinence,  non-surgical options include expectant management, weight loss, physical therapy, as well as a pessary.  Surgical options include a midurethral sling, Burch urethropexy, and transurethral injection of a bulking agent. - She is interested in a midurethral sling.   2. Hx of AIS - planning for hysterectomy.  Will coordinate with Dr Damita Dunnings  3. Urinary urgency - discussed increasing water intake and decreasing use of bladder irritants- list provided.   4. Leukocytes in urine - urine culture sent  Return for pre op visit.    Jaquita Folds, MD

## 2021-09-26 NOTE — Patient Instructions (Signed)

## 2021-09-27 LAB — URINE CULTURE: Culture: NO GROWTH

## 2021-10-30 ENCOUNTER — Other Ambulatory Visit (HOSPITAL_BASED_OUTPATIENT_CLINIC_OR_DEPARTMENT_OTHER): Payer: Self-pay | Admitting: Obstetrics & Gynecology

## 2021-10-30 DIAGNOSIS — Z1231 Encounter for screening mammogram for malignant neoplasm of breast: Secondary | ICD-10-CM

## 2021-11-01 ENCOUNTER — Encounter: Payer: Self-pay | Admitting: Obstetrics and Gynecology

## 2021-11-01 ENCOUNTER — Ambulatory Visit (INDEPENDENT_AMBULATORY_CARE_PROVIDER_SITE_OTHER): Payer: BC Managed Care – PPO

## 2021-11-01 ENCOUNTER — Ambulatory Visit (INDEPENDENT_AMBULATORY_CARE_PROVIDER_SITE_OTHER): Payer: BC Managed Care – PPO | Admitting: Obstetrics and Gynecology

## 2021-11-01 VITALS — BP 130/85 | HR 93

## 2021-11-01 DIAGNOSIS — Z1231 Encounter for screening mammogram for malignant neoplasm of breast: Secondary | ICD-10-CM | POA: Diagnosis not present

## 2021-11-01 DIAGNOSIS — Z8541 Personal history of malignant neoplasm of cervix uteri: Secondary | ICD-10-CM

## 2021-11-01 DIAGNOSIS — N393 Stress incontinence (female) (male): Secondary | ICD-10-CM | POA: Diagnosis not present

## 2021-11-01 MED ORDER — POLYETHYLENE GLYCOL 3350 17 GM/SCOOP PO POWD: 17.0000 g | Freq: Every day | ORAL | 0 refills | Status: DC

## 2021-11-01 MED ORDER — IBUPROFEN 600 MG PO TABS: 600.0000 mg | ORAL_TABLET | Freq: Four times a day (QID) | ORAL | 0 refills | Status: DC | PRN

## 2021-11-01 MED ORDER — ACETAMINOPHEN 500 MG PO TABS: 500.0000 mg | ORAL_TABLET | Freq: Four times a day (QID) | ORAL | 0 refills | Status: DC | PRN

## 2021-11-01 MED ORDER — OXYCODONE HCL 5 MG PO TABS: 5.0000 mg | ORAL_TABLET | ORAL | 0 refills | Status: DC | PRN

## 2021-11-01 NOTE — H&P (Signed)
[facility] Pre-Operative H&P  Subjective Chief Complaint: Rachael Dickerson presents for a preoperative encounter.   History of Present Illness: Rachael Dickerson is a 41 y.o. female who presents for preoperative visit.  She is scheduled to undergo Total vaginal hysterectomy, bilateral salpingectomy, possible McCall's culdoplasty, midurethral sling, cystoscopy on 11/20/21.  She has a history of AIS of the cervix.   CMG showed normal sensation, and normal cystometric capacity. Findings positive for stress incontinence, negative for detrusor overactivity.    Last pap was 04/2021: LSIL CERVIX, BIOPSY:  - Koilocytic atypia, consistent with low-grade squamous intraepithelial  lesion (CIN1, low-grade dysplasia)  - Benign cervical glandular mucosa   B. ENDOCERVIX, CURETTAGE:  - Mucoinflammatory debris  - Minute fragments of benign cervical glandular mucosa   09/17/21 EMB: ENDOMETRIUM, BIOPSY:  -    Decidualized endometrium with glandular regression, characteristic  for exogenous progestin effect.  -    Negative for endometrial hyperplasia, intraepithelial neoplasia  (EIN) and malignancy.   B.   ENDOCERVIX, CURETTAGE:  -    Benign endocervical fragments.  -    No features of adenocarcinoma in situ (AIS) identified.   Past Medical History:  Diagnosis Date   Adenocarcinoma in situ of cervix 11/22/2015   2012. Last pap was 11/16/2015 and cells were normal.    Benign cyst of breast 11/22/2015   Essential hypertension, benign 11/22/2015   Gestational diabetes    Hypertension    Insulin resistance 11/22/2015   Obesity 11/22/2015   Vaginal Pap smear, abnormal      Past Surgical History:  Procedure Laterality Date   CERVICAL CONE BIOPSY  06/2010   ivf     TONSILLECTOMY      is allergic to cranberry, erythromycin, and latex.   Family History  Problem Relation Age of Onset   Hypertension Mother    Cancer Maternal Grandmother        melanoma   Breast cancer Other        Great  MGM    Social History   Tobacco Use   Smoking status: Every Day    Packs/day: 1.00    Years: 20.00    Total pack years: 20.00    Types: Cigarettes   Smokeless tobacco: Never  Vaping Use   Vaping Use: Never used  Substance Use Topics   Alcohol use: Not Currently    Comment: socially   Drug use: No     Review of Systems was negative for a full 10 system review except as noted in the History of Present Illness.  No current facility-administered medications for this encounter.  Current Outpatient Medications:    acetaminophen (TYLENOL) 500 MG tablet, Take 1 tablet (500 mg total) by mouth every 6 (six) hours as needed (pain)., Disp: 30 tablet, Rfl: 0   enalapril (VASOTEC) 20 MG tablet, Take 1 tablet (20 mg total) by mouth daily., Disp: 90 tablet, Rfl: 3   fluticasone (FLONASE) 50 MCG/ACT nasal spray, Place into the nose., Disp: , Rfl:    ibuprofen (ADVIL) 600 MG tablet, Take 1 tablet (600 mg total) by mouth every 6 (six) hours as needed., Disp: 30 tablet, Rfl: 0   levocetirizine (XYZAL) 5 MG tablet, Take 5 mg by mouth every evening., Disp: , Rfl:    levonorgestrel (MIRENA) 20 MCG/24HR IUD, 1 each by Intrauterine route once., Disp: , Rfl:    oxyCODONE (OXY IR/ROXICODONE) 5 MG immediate release tablet, Take 1 tablet (5 mg total) by mouth every 4 (four) hours as needed for  severe pain., Disp: 10 tablet, Rfl: 0   polyethylene glycol powder (GLYCOLAX/MIRALAX) 17 GM/SCOOP powder, Take 17 g by mouth daily. Drink 17g (1 scoop) dissolved in water per day., Disp: 255 g, Rfl: 0   Objective There were no vitals filed for this visit.   Gen: NAD CV: S1 S2 RRR Lungs: Clear to auscultation bilaterally Abd: soft, nontender   Previous Pelvic Exam showed: POP-Q:    POP-Q   -2.5                                            Aa   -2.5                                           Ba   -8                                              C    3                                            Gh   4.5                                             Pb   9                                            tvl    -3                                            Ap   -3                                            Bp   -8.5                                              D         Assessment/ Plan  The patient is a 41 y.o. year old with history of AIS of the cervix and SUI scheduled to undergo Total vaginal hysterectomy, bilateral salpingectomy, possible McCall's culdoplasty, midurethral sling, cystoscopy.    Rachael Beards, MD

## 2021-11-01 NOTE — Progress Notes (Signed)
[facility] Pre-Operative evaluation  Subjective Chief Complaint: Rachael Dickerson presents for a preoperative encounter.   History of Present Illness: Rachael Dickerson is a 41 y.o. female who presents for preoperative visit.  She is scheduled to undergo Total vaginal hysterectomy, bilateral salpingectomy, possible McCall's culdoplasty, midurethral sling, cystoscopy on 11/20/21.  She has a history of AIS of the cervix.   CMG showed normal sensation, and normal cystometric capacity. Findings positive for stress incontinence, negative for detrusor overactivity.    Last pap was 04/2021: LSIL CERVIX, BIOPSY:  - Koilocytic atypia, consistent with low-grade squamous intraepithelial  lesion (CIN1, low-grade dysplasia)  - Benign cervical glandular mucosa   B. ENDOCERVIX, CURETTAGE:  - Mucoinflammatory debris  - Minute fragments of benign cervical glandular mucosa   09/17/21 EMB: ENDOMETRIUM, BIOPSY:  -    Decidualized endometrium with glandular regression, characteristic  for exogenous progestin effect.  -    Negative for endometrial hyperplasia, intraepithelial neoplasia  (EIN) and malignancy.   B.   ENDOCERVIX, CURETTAGE:  -    Benign endocervical fragments.  -    No features of adenocarcinoma in situ (AIS) identified.   Past Medical History:  Diagnosis Date   Adenocarcinoma in situ of cervix 11/22/2015   2012. Last pap was 11/16/2015 and cells were normal.    Benign cyst of breast 11/22/2015   Essential hypertension, benign 11/22/2015   Gestational diabetes    Hypertension    Insulin resistance 11/22/2015   Obesity 11/22/2015   Vaginal Pap smear, abnormal      Past Surgical History:  Procedure Laterality Date   CERVICAL CONE BIOPSY  06/2010   ivf     TONSILLECTOMY      is allergic to cranberry, erythromycin, and latex.   Family History  Problem Relation Age of Onset   Hypertension Mother    Cancer Maternal Grandmother        melanoma   Breast cancer Other         Great MGM    Social History   Tobacco Use   Smoking status: Every Day    Packs/day: 1.00    Years: 20.00    Total pack years: 20.00    Types: Cigarettes   Smokeless tobacco: Never  Vaping Use   Vaping Use: Never used  Substance Use Topics   Alcohol use: Not Currently    Comment: socially   Drug use: No     Review of Systems was negative for a full 10 system review except as noted in the History of Present Illness.   Current Outpatient Medications:    enalapril (VASOTEC) 20 MG tablet, Take 1 tablet (20 mg total) by mouth daily., Disp: 90 tablet, Rfl: 3   fluticasone (FLONASE) 50 MCG/ACT nasal spray, Place into the nose., Disp: , Rfl:    levocetirizine (XYZAL) 5 MG tablet, Take 5 mg by mouth every evening., Disp: , Rfl:    levonorgestrel (MIRENA) 20 MCG/24HR IUD, 1 each by Intrauterine route once., Disp: , Rfl:    Objective Vitals:   11/01/21 0914  BP: 130/85  Pulse: 93    Gen: NAD CV: S1 S2 RRR Lungs: Clear to auscultation bilaterally Abd: soft, nontender   Previous Pelvic Exam showed: POP-Q:    POP-Q   -2.5  Aa   -2.5                                           Ba   -8                                              C    3                                            Gh   4.5                                            Pb   9                                            tvl    -3                                            Ap   -3                                            Bp   -8.5                                              D         Assessment/ Plan  Assessment: The patient is a 41 y.o. year old scheduled to undergo Total vaginal hysterectomy, bilateral salpingectomy, possible McCall's culdoplasty, midurethral sling, cystoscopy. Verbal consent was obtained for these procedures.  Plan: General Surgical Consent: The patient has previously been counseled on alternative treatments, and the decision by the  patient and provider was to proceed with the procedure listed above.  For all procedures, there are risks of bleeding, infection, damage to surrounding organs including but not limited to bowel, bladder, blood vessels, ureters and nerves, and need for further surgery if an injury were to occur. These risks are all low with minimally invasive surgery.   There are risks of numbness and weakness at any body site or buttock/rectal pain.  It is possible that baseline pain can be worsened by surgery, either with or without mesh. If surgery is vaginal, there is also a low risk of possible conversion to laparoscopy or open abdominal incision where indicated. Very rare risks include blood transfusion, blood clot, heart attack, pneumonia, or death.   There is also a risk of short-term postoperative urinary retention with need to use a catheter. About half of patients need to go home from surgery with a catheter, which is  then later removed in the office. The risk of long-term need for a catheter is very low. There is also a risk of worsening of overactive bladder.   Sling: The effectiveness of a midurethral vaginal mesh sling is approximately 85%, and thus, there will be times when you may leak urine after surgery, especially if your bladder is full or if you have a strong cough. There is a balance between making the sling tight enough to treat your leakage but not too tight so that you have long-term difficulty emptying your bladder. A mesh sling will not directly treat overactive bladder/urge incontinence and may worsen it.  There is an FDA safety notification on vaginal mesh procedures for prolapse but NOT mesh slings. We have extensive experience and training with mesh placement and we have close postoperative follow up to identify any potential complications from mesh. It is important to realize that this mesh is a permanent implant that cannot be easily removed. There are rare risks of mesh exposure (2-4%),  pain with intercourse (0-7%), and infection (<1%). The risk of mesh exposure if more likely in a woman with risks for poor healing (prior radiation, poorly controlled diabetes, or immunocompromised). The risk of new or worsened chronic pain after mesh implant is more common in women with baseline chronic pain and/or poorly controlled anxiety or depression. Approximately 2-4% of patients will experience longer-term post-operative voiding dysfunction that may require surgical revision of the sling. We also reviewed that postoperatively, her stream may not be as strong as before surgery.   We discussed consent for blood products. Risks for blood transfusion include allergic reactions, other reactions that can affect different body organs and managed accordingly, transmission of infectious diseases such as HIV or Hepatitis. However, the blood is screened. Patient consents for blood products.  Pre-operative instructions:  She was instructed to not take Aspirin/NSAIDs x 7days prior to surgery.  Antibiotic prophylaxis was ordered as indicated.  Catheter use: Patient will go home with foley if needed after post-operative voiding trial.  Post-operative instructions:  She was provided with specific post-operative instructions, including precautions and signs/symptoms for which we would recommend contacting us, in addition to daytime and after-hours contact phone numbers. This was provided on a handout.   Post-operative medications: Prescriptions for motrin, tylenol, miralax, and oxycodone were sent to her pharmacy. Discussed using ibuprofen and tylenol on a schedule to limit use of narcotics.   Laboratory testing:  We will check labs: type and screen  Preoperative clearance:  She does not require surgical clearance.    Post-operative follow-up:  A post-operative appointment will be made for 6 weeks from the date of surgery. If she needs a post-operative nurse visit for a voiding trial, that will be set up  after she leaves the hospital.    Patient will call the clinic or use MyChart should anything change or any new issues arise.   Marguerita Beards, MD  Time spent: I spent 25 minutes dedicated to the care of this patient on the date of this encounter to include pre-visit review of records, face-to-face time with the patient and post visit documentation and ordering medication/ testing.

## 2021-11-02 ENCOUNTER — Encounter: Payer: Self-pay | Admitting: Obstetrics & Gynecology

## 2021-11-05 ENCOUNTER — Other Ambulatory Visit: Payer: Self-pay | Admitting: Obstetrics & Gynecology

## 2021-11-05 DIAGNOSIS — R928 Other abnormal and inconclusive findings on diagnostic imaging of breast: Secondary | ICD-10-CM

## 2021-11-08 ENCOUNTER — Other Ambulatory Visit: Payer: Self-pay

## 2021-11-08 ENCOUNTER — Encounter (HOSPITAL_BASED_OUTPATIENT_CLINIC_OR_DEPARTMENT_OTHER): Payer: Self-pay | Admitting: Obstetrics and Gynecology

## 2021-11-08 NOTE — Progress Notes (Signed)
Spoke w/ via phone for pre-op interview---Rachael Dickerson needs dos---- urine pregnancy             Dickerson results------11/12/21 Dickerson appt for cbc, bmp, type & screen, EKG COVID test -----patient states asymptomatic no test needed Arrive at -------0930 on Tuesday, November 20, 2021 NPO after MN NO Solid Food.  Clear liquids from MN until---0830 Med rec completed Medications to take morning of surgery -----Xyzal, Flonase prn Diabetic medication -----n/a Patient instructed no nail polish to be worn day of surgery Patient instructed to bring photo id and insurance card day of surgery Patient aware to have Driver (ride ) / caregiver    for 24 hours after surgery - husband, Acupuncturist Patient Special Instructions -----Extended / Overnight stay instructions given. Pre-Op special Istructions -----none Patient verbalized understanding of instructions that were given at this phone interview. Patient denies shortness of breath, chest pain, fever, cough at this phone interview.

## 2021-11-08 NOTE — Progress Notes (Signed)
Your procedure is scheduled on Tuesday, 11/20/21.  Report to [facility] AT  9:30 A. M.   Call this number if you have problems the morning of surgery  :[phone].   OUR ADDRESS IS [address].  WE ARE LOCATED IN [facility].  PLEASE BRING YOUR INSURANCE CARD AND PHOTO ID DAY OF SURGERY.  ONLY 2 PEOPLE ARE ALLOWED IN  WAITING  ROOM.                                      REMEMBER:  DO NOT EAT FOOD, CANDY GUM OR MINTS  AFTER MIDNIGHT THE NIGHT BEFORE YOUR SURGERY . YOU MAY HAVE CLEAR LIQUIDS FROM MIDNIGHT THE NIGHT BEFORE YOUR SURGERY UNTIL  8:30 AM. NO CLEAR LIQUIDS AFTER   8:30 AM DAY OF SURGERY.  YOU MAY  BRUSH YOUR TEETH MORNING OF SURGERY AND RINSE YOUR MOUTH OUT, NO CHEWING GUM CANDY OR MINTS.     CLEAR LIQUID DIET   Foods Allowed                                                                     Foods Excluded  Coffee and tea, regular and decaf                             liquids that you cannot  Plain Jell-O                                                                   see through such as: Fruit ices (not with fruit pulp)                                     milk, soups, orange juice  Plain  Popsicles                                    All solid food Carbonated beverages, regular and diet                                    Cranberry, grape and apple juices Sports drinks like Gatorade _____________________________________________________________________     TAKE THESE MEDICATIONS MORNING OF SURGERY: Xyzal, Flonase    UP TO 4 VISITORS  MAY VISIT IN THE EXTENDED RECOVERY ROOM UNTIL 800 PM ONLY.  ONE  VISITOR AGE 2 AND OVER MAY SPEND THE NIGHT AND MUST BE IN EXTENDED RECOVERY ROOM NO LATER THAN 800 PM . YOUR DISCHARGE TIME AFTER YOU SPEND THE NIGHT IS 900 AM THE MORNING AFTER YOUR SURGERY.  YOU MAY PACK A SMALL OVERNIGHT BAG WITH TOILETRIES FOR YOUR OVERNIGHT STAY  IF YOU WISH.  YOUR PRESCRIPTION MEDICATIONS WILL BE  PROVIDED DURING [facility] STAY.                                      DO NOT WEAR JEWERLY, MAKE UP. DO NOT WEAR LOTIONS, POWDERS, PERFUMES OR NAIL POLISH ON YOUR FINGERNAILS. TOENAIL POLISH IS OK TO WEAR. DO NOT SHAVE FOR 48 HOURS PRIOR TO DAY OF SURGERY. MEN MAY SHAVE FACE AND NECK. CONTACTS, GLASSES, OR DENTURES MAY NOT BE WORN TO SURGERY.  REMEMBER: NO SMOKING, DRUGS OR ALCOHOL FOR 24 HOURS BEFORE YOUR SURGERY. DO THE BEST YOU CAN NOT TO SMOKE FOR 24 HOURS BEFORE SURGERY. NOT SMOKING WILL HELP YOUR RECOVERY.                                    [facility] IS NOT RESPONSIBLE  FOR ANY BELONGINGS.                                                                    Marland Kitchen           [place] - Preparing for Surgery Before surgery, you can play an important role.  Because skin is not sterile, your skin needs to be as free of germs as possible.  You can reduce the number of germs on your skin by washing with CHG (chlorahexidine gluconate) soap before surgery.  CHG is an antiseptic cleaner which kills germs and bonds with the skin to continue killing germs even after washing. Please DO NOT use if you have an allergy to CHG or antibacterial soaps.  If your skin becomes reddened/irritated stop using the CHG and inform your nurse when you arrive at Short Stay. Do not shave (including legs and underarms) for at least 48 hours prior to the first CHG shower.  You may shave your face/neck. Please follow these instructions carefully:  1.  Shower with CHG Soap the night before surgery and the  morning of Surgery.  2.  If you choose to wash your hair, wash your hair first as usual with your  normal  shampoo.  3.  After you shampoo, rinse your hair and body thoroughly to remove the  shampoo.                            4.  Use CHG as you would any other liquid soap.  You can apply chg directly  to the skin and wash , please wash your belly button thoroughly with chg soap provided night before and morning of  your surgery.                     Gently with a scrungie or clean washcloth.  5.  Apply the CHG Soap to your body ONLY FROM THE NECK DOWN.   Do not use on face/ open                           Wound or open sores. Avoid contact with  eyes, ears mouth and genitals (private parts).                       Wash face,  Genitals (private parts) with your normal soap.             6.  Wash thoroughly, paying special attention to the area where your surgery  will be performed.  7.  Thoroughly rinse your body with warm water from the neck down.  8.  DO NOT shower/wash with your normal soap after using and rinsing off  the CHG Soap.                9.  Pat yourself dry with a clean towel.            10.  Wear clean pajamas.            11.  Place clean sheets on your bed the night of your first shower and do not  sleep with pets. Day of Surgery : Do not apply any lotions/deodorants the morning of surgery.  Please wear clean clothes to the hospital/surgery center.  IF YOU HAVE ANY SKIN IRRITATION OR PROBLEMS WITH THE SURGICAL SOAP, PLEASE GET A BAR OF GOLD DIAL SOAP AND SHOWER THE NIGHT BEFORE YOUR SURGERY AND THE MORNING OF YOUR SURGERY. PLEASE LET THE NURSE KNOW MORNING OF YOUR SURGERY IF YOU HAD ANY PROBLEMS WITH THE SURGICAL SOAP.   ________________________________________________________________________                                                        QUESTIONS [facility] PRE OP NURSE PHONE [phone].

## 2021-11-12 ENCOUNTER — Encounter (HOSPITAL_COMMUNITY)
Admission: RE | Admit: 2021-11-12 | Discharge: 2021-11-12 | Disposition: A | Discharge status: Home / Self Care | Payer: BC Managed Care – PPO | Source: Ambulatory Visit | Attending: Obstetrics and Gynecology | Admitting: Obstetrics and Gynecology

## 2021-11-12 ENCOUNTER — Ambulatory Visit
Admission: RE | Admit: 2021-11-12 | Discharge: 2021-11-12 | Disposition: A | Discharge status: Home / Self Care | Payer: BC Managed Care – PPO | Source: Ambulatory Visit | Attending: Obstetrics & Gynecology | Admitting: Obstetrics & Gynecology

## 2021-11-12 ENCOUNTER — Other Ambulatory Visit: Payer: Self-pay | Admitting: Obstetrics & Gynecology

## 2021-11-12 DIAGNOSIS — N6320 Unspecified lump in the left breast, unspecified quadrant: Secondary | ICD-10-CM

## 2021-11-12 DIAGNOSIS — R928 Other abnormal and inconclusive findings on diagnostic imaging of breast: Secondary | ICD-10-CM

## 2021-11-12 DIAGNOSIS — Z01818 Encounter for other preprocedural examination: Secondary | ICD-10-CM | POA: Insufficient documentation

## 2021-11-12 DIAGNOSIS — Z8541 Personal history of malignant neoplasm of cervix uteri: Secondary | ICD-10-CM | POA: Diagnosis not present

## 2021-11-12 LAB — CBC
HCT: 45.6 % (ref 36.0–46.0)
Hemoglobin: 15.4 g/dL — ABNORMAL HIGH (ref 12.0–15.0)
MCH: 30.3 pg (ref 26.0–34.0)
MCHC: 33.8 g/dL (ref 30.0–36.0)
MCV: 89.6 fL (ref 80.0–100.0)
Platelets: 389 10*3/uL (ref 150–400)
RBC: 5.09 MIL/uL (ref 3.87–5.11)
RDW: 12.8 % (ref 11.5–15.5)
WBC: 9.9 10*3/uL (ref 4.0–10.5)
nRBC: 0 % (ref 0.0–0.2)

## 2021-11-12 LAB — BASIC METABOLIC PANEL
Anion gap: 10 (ref 5–15)
BUN: 12 mg/dL (ref 6–20)
CO2: 23 mmol/L (ref 22–32)
Calcium: 9.2 mg/dL (ref 8.9–10.3)
Chloride: 105 mmol/L (ref 98–111)
Creatinine, Ser: 0.41 mg/dL — ABNORMAL LOW (ref 0.44–1.00)
GFR, Estimated: >60 mL/min (ref >60)
Glucose, Bld: 95 mg/dL (ref 70–99)
Potassium: 4 mmol/L (ref 3.5–5.1)
Sodium: 138 mmol/L (ref 135–145)

## 2021-11-14 NOTE — Progress Notes (Signed)
Patient called with a few questions about her upcoming surgery on 11/20/21. All questions were answered, and patient was instructed to call with any more questions or concerns.

## 2021-11-19 NOTE — Anesthesia Preprocedure Evaluation (Signed)
Anesthesia Evaluation  Patient identified by MRN, date of birth, ID band Patient awake    Reviewed: Allergy & Precautions, NPO status , Patient's Chart, lab work & pertinent test results  History of Anesthesia Complications (+) PONV and history of anesthetic complications  Airway Mallampati: II  TM Distance: >3 FB Neck ROM: Full    Dental no notable dental hx. (+) Teeth Intact, Dental Advisory Given   Pulmonary Current SmokerPatient did not abstain from smoking.,    Pulmonary exam normal breath sounds clear to auscultation       Cardiovascular hypertension, Normal cardiovascular exam Rhythm:Regular Rate:Normal     Neuro/Psych    GI/Hepatic negative GI ROS, Neg liver ROS,   Endo/Other  diabetes  Renal/GU      Musculoskeletal   Abdominal   Peds  Hematology Lab Results      Component                Value               Date                      WBC                      9.9                 11/12/2021                HGB                      15.4 (H)            11/12/2021                HCT                      45.6                11/12/2021                MCV                      89.6                11/12/2021                PLT                      389                 11/12/2021              Anesthesia Other Findings All: to Latex and erythromycin  Reproductive/Obstetrics                            Anesthesia Physical Anesthesia Plan  ASA: 3  Anesthesia Plan: General   Post-op Pain Management: Toradol IV (intra-op)* and Tylenol PO (pre-op)*   Induction: Intravenous  PONV Risk Score and Plan: 4 or greater and Treatment may vary due to age or medical condition, Midazolam, Dexamethasone, Ondansetron and Scopolamine patch - Pre-op  Airway Management Planned: Oral ETT  Additional Equipment: None  Intra-op Plan:   Post-operative Plan: Extubation in OR  Informed Consent: I have  reviewed the patients History and Physical, chart, labs and discussed the procedure including the risks, benefits  and alternatives for the proposed anesthesia with the patient or authorized representative who has indicated his/her understanding and acceptance.     Dental advisory given  Plan Discussed with:   Anesthesia Plan Comments:        Anesthesia Quick Evaluation

## 2021-11-20 ENCOUNTER — Ambulatory Visit (HOSPITAL_BASED_OUTPATIENT_CLINIC_OR_DEPARTMENT_OTHER): Payer: BC Managed Care – PPO | Admitting: Anesthesiology

## 2021-11-20 ENCOUNTER — Encounter (HOSPITAL_BASED_OUTPATIENT_CLINIC_OR_DEPARTMENT_OTHER)
Admission: RE | Disposition: A | Discharge status: Home / Self Care | Payer: Self-pay | Source: Home / Self Care | Attending: Obstetrics and Gynecology

## 2021-11-20 ENCOUNTER — Ambulatory Visit (HOSPITAL_BASED_OUTPATIENT_CLINIC_OR_DEPARTMENT_OTHER)
Admission: RE | Admit: 2021-11-20 | Discharge: 2021-11-20 | Disposition: A | Discharge status: Home / Self Care | Payer: BC Managed Care – PPO | Attending: Obstetrics and Gynecology | Admitting: Obstetrics and Gynecology

## 2021-11-20 ENCOUNTER — Other Ambulatory Visit: Payer: Self-pay

## 2021-11-20 ENCOUNTER — Encounter (HOSPITAL_BASED_OUTPATIENT_CLINIC_OR_DEPARTMENT_OTHER): Payer: Self-pay | Admitting: Obstetrics and Gynecology

## 2021-11-20 DIAGNOSIS — I10 Essential (primary) hypertension: Secondary | ICD-10-CM | POA: Diagnosis not present

## 2021-11-20 DIAGNOSIS — Z01818 Encounter for other preprocedural examination: Secondary | ICD-10-CM

## 2021-11-20 DIAGNOSIS — D069 Carcinoma in situ of cervix, unspecified: Secondary | ICD-10-CM | POA: Diagnosis present

## 2021-11-20 DIAGNOSIS — F17210 Nicotine dependence, cigarettes, uncomplicated: Secondary | ICD-10-CM | POA: Insufficient documentation

## 2021-11-20 DIAGNOSIS — R87612 Low grade squamous intraepithelial lesion on cytologic smear of cervix (LGSIL): Secondary | ICD-10-CM | POA: Insufficient documentation

## 2021-11-20 DIAGNOSIS — E119 Type 2 diabetes mellitus without complications: Secondary | ICD-10-CM | POA: Diagnosis not present

## 2021-11-20 DIAGNOSIS — N870 Mild cervical dysplasia: Secondary | ICD-10-CM | POA: Diagnosis not present

## 2021-11-20 DIAGNOSIS — N393 Stress incontinence (female) (male): Secondary | ICD-10-CM | POA: Insufficient documentation

## 2021-11-20 DIAGNOSIS — Z8541 Personal history of malignant neoplasm of cervix uteri: Secondary | ICD-10-CM

## 2021-11-20 HISTORY — PX: CYSTOSCOPY: SHX5120

## 2021-11-20 HISTORY — PX: BLADDER SUSPENSION: SHX72

## 2021-11-20 HISTORY — PX: VAGINAL PROLAPSE REPAIR: SHX830

## 2021-11-20 HISTORY — DX: Presence of spectacles and contact lenses: Z97.3

## 2021-11-20 HISTORY — PX: VAGINAL HYSTERECTOMY: SHX2639

## 2021-11-20 LAB — TYPE AND SCREEN
ABO/RH(D): O POS
Antibody Screen: NEGATIVE

## 2021-11-20 LAB — POCT PREGNANCY, URINE: Preg Test, Ur: NEGATIVE

## 2021-11-20 SURGERY — HYSTERECTOMY, VAGINAL
Anesthesia: General | Site: Vagina

## 2021-11-20 MED ORDER — GABAPENTIN 300 MG PO CAPS
ORAL_CAPSULE | ORAL | Status: AC
  Filled 2021-11-20: qty 1

## 2021-11-20 MED ORDER — OXYCODONE HCL 5 MG PO TABS
ORAL_TABLET | ORAL | Status: AC
  Filled 2021-11-20: qty 1

## 2021-11-20 MED ORDER — DEXAMETHASONE SODIUM PHOSPHATE 10 MG/ML IJ SOLN
INTRAMUSCULAR | Status: DC | PRN
  Administered 2021-11-20: 8 mg via INTRAVENOUS

## 2021-11-20 MED ORDER — CEFAZOLIN SODIUM-DEXTROSE 2-4 GM/100ML-% IV SOLN
2.0000 g | INTRAVENOUS | Status: AC
  Administered 2021-11-20: 2 g via INTRAVENOUS

## 2021-11-20 MED ORDER — FENTANYL CITRATE (PF) 250 MCG/5ML IJ SOLN
INTRAMUSCULAR | Status: AC
  Filled 2021-11-20: qty 5

## 2021-11-20 MED ORDER — KETOROLAC TROMETHAMINE 30 MG/ML IJ SOLN: 30.0000 mg | Freq: Once | INTRAMUSCULAR | Status: DC | PRN

## 2021-11-20 MED ORDER — ROCURONIUM BROMIDE 10 MG/ML (PF) SYRINGE
PREFILLED_SYRINGE | INTRAVENOUS | Status: AC
  Filled 2021-11-20: qty 10

## 2021-11-20 MED ORDER — OXYCODONE HCL 5 MG PO TABS: 5.0000 mg | ORAL_TABLET | Freq: Once | ORAL | Status: DC | PRN

## 2021-11-20 MED ORDER — HYDROMORPHONE HCL 1 MG/ML IJ SOLN
0.2500 mg | INTRAMUSCULAR | Status: DC | PRN
  Administered 2021-11-20: 0.25 mg via INTRAVENOUS

## 2021-11-20 MED ORDER — SUGAMMADEX SODIUM 200 MG/2ML IV SOLN
INTRAVENOUS | Status: DC | PRN
  Administered 2021-11-20: 200 mg via INTRAVENOUS

## 2021-11-20 MED ORDER — ONDANSETRON HCL 4 MG/2ML IJ SOLN
INTRAMUSCULAR | Status: AC
  Filled 2021-11-20: qty 2

## 2021-11-20 MED ORDER — SIMETHICONE 80 MG PO CHEW: 80.0000 mg | CHEWABLE_TABLET | Freq: Four times a day (QID) | ORAL | Status: DC | PRN

## 2021-11-20 MED ORDER — ROCURONIUM BROMIDE 10 MG/ML (PF) SYRINGE
PREFILLED_SYRINGE | INTRAVENOUS | Status: DC | PRN
  Administered 2021-11-20: 70 mg via INTRAVENOUS

## 2021-11-20 MED ORDER — KETOROLAC TROMETHAMINE 30 MG/ML IJ SOLN
INTRAMUSCULAR | Status: DC | PRN
  Administered 2021-11-20: 30 mg via INTRAVENOUS

## 2021-11-20 MED ORDER — CEFAZOLIN SODIUM-DEXTROSE 2-4 GM/100ML-% IV SOLN
INTRAVENOUS | Status: AC
  Filled 2021-11-20: qty 100

## 2021-11-20 MED ORDER — LIDOCAINE-EPINEPHRINE 1 %-1:100000 IJ SOLN
INTRAMUSCULAR | Status: DC | PRN
  Administered 2021-11-20: 6 mL
  Administered 2021-11-20: 10 mL

## 2021-11-20 MED ORDER — GLYCOPYRROLATE PF 0.2 MG/ML IJ SOSY
PREFILLED_SYRINGE | INTRAMUSCULAR | Status: AC
  Filled 2021-11-20: qty 1

## 2021-11-20 MED ORDER — GLYCOPYRROLATE PF 0.2 MG/ML IJ SOSY
PREFILLED_SYRINGE | INTRAMUSCULAR | Status: DC | PRN
  Administered 2021-11-20: .2 mg via INTRAVENOUS

## 2021-11-20 MED ORDER — HEMOSTATIC AGENTS (NO CHARGE) OPTIME: TOPICAL | Status: DC | PRN

## 2021-11-20 MED ORDER — DOCUSATE SODIUM 100 MG PO CAPS
100.0000 mg | ORAL_CAPSULE | Freq: Two times a day (BID) | ORAL | Status: DC
  Administered 2021-11-20: 100 mg via ORAL

## 2021-11-20 MED ORDER — SODIUM CHLORIDE 0.9 % IR SOLN
Status: DC | PRN
  Administered 2021-11-20: 500 mL via INTRAVESICAL
  Administered 2021-11-20: 500 mL

## 2021-11-20 MED ORDER — LACTATED RINGERS IV SOLN
INTRAVENOUS | Status: DC
  Administered 2021-11-20: 1 mL via INTRAVENOUS

## 2021-11-20 MED ORDER — HYDROMORPHONE HCL 1 MG/ML IJ SOLN
INTRAMUSCULAR | Status: AC
  Filled 2021-11-20: qty 1

## 2021-11-20 MED ORDER — PROPOFOL 10 MG/ML IV BOLUS
INTRAVENOUS | Status: AC
  Filled 2021-11-20: qty 20

## 2021-11-20 MED ORDER — ACETAMINOPHEN 10 MG/ML IV SOLN
INTRAVENOUS | Status: AC
  Filled 2021-11-20: qty 100

## 2021-11-20 MED ORDER — LIDOCAINE HCL (PF) 2 % IJ SOLN
INTRAMUSCULAR | Status: AC
  Filled 2021-11-20: qty 5

## 2021-11-20 MED ORDER — ONDANSETRON HCL 4 MG/2ML IJ SOLN
INTRAMUSCULAR | Status: DC | PRN
  Administered 2021-11-20: 4 mg via INTRAVENOUS

## 2021-11-20 MED ORDER — SURGIFLO WITH THROMBIN (HEMOSTATIC MATRIX KIT) OPTIME
TOPICAL | Status: DC | PRN
  Administered 2021-11-20: 1 via TOPICAL

## 2021-11-20 MED ORDER — DOCUSATE SODIUM 100 MG PO CAPS
ORAL_CAPSULE | ORAL | Status: AC
  Filled 2021-11-20: qty 1

## 2021-11-20 MED ORDER — MIDAZOLAM HCL 2 MG/2ML IJ SOLN
INTRAMUSCULAR | Status: AC
  Filled 2021-11-20: qty 2

## 2021-11-20 MED ORDER — PHENAZOPYRIDINE HCL 100 MG PO TABS
200.0000 mg | ORAL_TABLET | ORAL | Status: AC
  Administered 2021-11-20: 200 mg via ORAL

## 2021-11-20 MED ORDER — GABAPENTIN 300 MG PO CAPS
300.0000 mg | ORAL_CAPSULE | ORAL | Status: AC
  Administered 2021-11-20: 300 mg via ORAL

## 2021-11-20 MED ORDER — MIDAZOLAM HCL 2 MG/2ML IJ SOLN
INTRAMUSCULAR | Status: DC | PRN
  Administered 2021-11-20: 2 mg via INTRAVENOUS

## 2021-11-20 MED ORDER — KETOROLAC TROMETHAMINE 30 MG/ML IJ SOLN
INTRAMUSCULAR | Status: AC
  Filled 2021-11-20: qty 1

## 2021-11-20 MED ORDER — ONDANSETRON HCL 4 MG/2ML IJ SOLN: 4.0000 mg | Freq: Once | INTRAMUSCULAR | Status: DC | PRN

## 2021-11-20 MED ORDER — EPHEDRINE 5 MG/ML INJ
INTRAVENOUS | Status: AC
  Filled 2021-11-20: qty 5

## 2021-11-20 MED ORDER — SCOPOLAMINE 1 MG/3DAYS TD PT72
1.0000 | MEDICATED_PATCH | TRANSDERMAL | Status: DC
  Administered 2021-11-20: 1.5 mg via TRANSDERMAL

## 2021-11-20 MED ORDER — DEXAMETHASONE SODIUM PHOSPHATE 10 MG/ML IJ SOLN
INTRAMUSCULAR | Status: AC
  Filled 2021-11-20: qty 1

## 2021-11-20 MED ORDER — FENTANYL CITRATE (PF) 100 MCG/2ML IJ SOLN
INTRAMUSCULAR | Status: DC | PRN
  Administered 2021-11-20: 25 ug via INTRAVENOUS
  Administered 2021-11-20: 50 ug via INTRAVENOUS
  Administered 2021-11-20: 25 ug via INTRAVENOUS
  Administered 2021-11-20: 100 ug via INTRAVENOUS
  Administered 2021-11-20: 50 ug via INTRAVENOUS

## 2021-11-20 MED ORDER — SCOPOLAMINE 1 MG/3DAYS TD PT72
MEDICATED_PATCH | TRANSDERMAL | Status: AC
  Filled 2021-11-20: qty 1

## 2021-11-20 MED ORDER — LIDOCAINE 2% (20 MG/ML) 5 ML SYRINGE
INTRAMUSCULAR | Status: DC | PRN
  Administered 2021-11-20: 100 mg via INTRAVENOUS

## 2021-11-20 MED ORDER — PHENAZOPYRIDINE HCL 100 MG PO TABS
ORAL_TABLET | ORAL | Status: AC
  Filled 2021-11-20: qty 2

## 2021-11-20 MED ORDER — PROPOFOL 10 MG/ML IV BOLUS
INTRAVENOUS | Status: DC | PRN
  Administered 2021-11-20: 30 mg via INTRAVENOUS
  Administered 2021-11-20: 170 mg via INTRAVENOUS

## 2021-11-20 MED ORDER — OXYCODONE HCL 5 MG PO TABS
5.0000 mg | ORAL_TABLET | ORAL | Status: DC | PRN
  Administered 2021-11-20 (×2): 5 mg via ORAL

## 2021-11-20 MED ORDER — EPHEDRINE SULFATE-NACL 50-0.9 MG/10ML-% IV SOSY
PREFILLED_SYRINGE | INTRAVENOUS | Status: DC | PRN
  Administered 2021-11-20: 10 mg via INTRAVENOUS

## 2021-11-20 MED ORDER — OXYCODONE HCL 5 MG/5ML PO SOLN: 5.0000 mg | Freq: Once | ORAL | Status: DC | PRN

## 2021-11-20 MED ORDER — ACETAMINOPHEN 325 MG PO TABS: 650.0000 mg | ORAL_TABLET | ORAL | Status: DC | PRN

## 2021-11-20 MED ORDER — POVIDONE-IODINE 10 % EX SWAB: 2.0000 | Freq: Once | CUTANEOUS | Status: DC

## 2021-11-20 MED ORDER — ONDANSETRON HCL 4 MG/2ML IJ SOLN: 4.0000 mg | Freq: Four times a day (QID) | INTRAMUSCULAR | Status: DC | PRN

## 2021-11-20 MED ORDER — ONDANSETRON HCL 4 MG PO TABS: 4.0000 mg | ORAL_TABLET | Freq: Four times a day (QID) | ORAL | Status: DC | PRN

## 2021-11-20 MED ORDER — ACETAMINOPHEN 10 MG/ML IV SOLN
INTRAVENOUS | Status: DC | PRN
  Administered 2021-11-20: 1000 mg via INTRAVENOUS

## 2021-11-20 SURGICAL SUPPLY — 60 items
ADH SKN CLS APL DERMABOND .7 (GAUZE/BANDAGES/DRESSINGS) ×3
AGENT HMST KT MTR STRL THRMB (HEMOSTASIS)
APL ESCP 34 STRL LF DISP (HEMOSTASIS)
APPLICATOR SURGIFLO ENDO (HEMOSTASIS) IMPLANT
BLADE CLIPPER SENSICLIP SURGIC (BLADE) ×4 IMPLANT
BLADE SURG 10 STRL SS (BLADE) ×1 IMPLANT
BLADE SURG 15 STRL LF DISP TIS (BLADE) ×3 IMPLANT
BLADE SURG 15 STRL SS (BLADE) ×4
DECANTER SPIKE VIAL GLASS SM (MISCELLANEOUS) ×4 IMPLANT
DERMABOND ADVANCED (GAUZE/BANDAGES/DRESSINGS) ×1
DERMABOND ADVANCED .7 DNX12 (GAUZE/BANDAGES/DRESSINGS) ×3 IMPLANT
DEVICE CAPIO SLIM SINGLE (INSTRUMENTS) IMPLANT
ELECT REM PT RETURN 9FT ADLT (ELECTROSURGICAL)
ELECTRODE REM PT RTRN 9FT ADLT (ELECTROSURGICAL) IMPLANT
GAUZE 4X4 16PLY ~~LOC~~+RFID DBL (SPONGE) ×8 IMPLANT
GLOVE BIO SURGEON STRL SZ 6 (GLOVE) ×4 IMPLANT
GLOVE BIOGEL PI IND STRL 6.5 (GLOVE) ×3 IMPLANT
GLOVE BIOGEL PI IND STRL 7.0 (GLOVE) ×3 IMPLANT
GLOVE BIOGEL PI INDICATOR 6.5 (GLOVE) ×1
GLOVE BIOGEL PI INDICATOR 7.0 (GLOVE) ×1
GLOVE ECLIPSE 6.0 STRL STRAW (GLOVE) ×4 IMPLANT
GOWN STRL REUS W/ TWL LRG LVL3 (GOWN DISPOSABLE) ×3 IMPLANT
GOWN STRL REUS W/TWL LRG LVL3 (GOWN DISPOSABLE) ×8 IMPLANT
HIBICLENS CHG 4% 4OZ BTL (MISCELLANEOUS) ×4 IMPLANT
HOLDER FOLEY CATH W/STRAP (MISCELLANEOUS) ×4 IMPLANT
KIT TURNOVER CYSTO (KITS) ×4 IMPLANT
LIGASURE IMPACT 36 18CM CVD LR (INSTRUMENTS) ×1 IMPLANT
MANIFOLD NEPTUNE II (INSTRUMENTS) ×4 IMPLANT
NDL MAYO 6 CRC TAPER PT (NEEDLE) ×3 IMPLANT
NEEDLE HYPO 22GX1.5 SAFETY (NEEDLE) ×4 IMPLANT
NEEDLE MAYO 6 CRC TAPER PT (NEEDLE) ×4 IMPLANT
NS IRRIG 1000ML POUR BTL (IV SOLUTION) ×4 IMPLANT
PACK CYSTO (CUSTOM PROCEDURE TRAY) ×4 IMPLANT
PACK VAGINAL WOMENS (CUSTOM PROCEDURE TRAY) ×4 IMPLANT
PAD OB MATERNITY 4.3X12.25 (PERSONAL CARE ITEMS) ×4 IMPLANT
RETRACTOR LONE STAR DISPOSABLE (INSTRUMENTS) ×4 IMPLANT
RETRACTOR STAY HOOK 5MM (MISCELLANEOUS) ×4 IMPLANT
SET IRRIG Y TYPE TUR BLADDER L (SET/KITS/TRAYS/PACK) ×4 IMPLANT
SPONGE T-LAP 18X36 ~~LOC~~+RFID STR (SPONGE) ×4 IMPLANT
SUCTION FRAZIER HANDLE 10FR (MISCELLANEOUS) ×1
SUCTION TUBE FRAZIER 10FR DISP (MISCELLANEOUS) ×3 IMPLANT
SURGIFLO W/THROMBIN 8M KIT (HEMOSTASIS) IMPLANT
SUT ABS MONO DBL WITH NDL 48IN (SUTURE) IMPLANT
SUT MON AB 2-0 SH 27 (SUTURE) IMPLANT
SUT PDS PLUS 0 (SUTURE) ×1
SUT PDS PLUS AB 0 CT-2 (SUTURE) ×12 IMPLANT
SUT VIC AB 0 CT1 27 (SUTURE) ×12
SUT VIC AB 0 CT1 27XBRD ANBCTR (SUTURE) ×3 IMPLANT
SUT VIC AB 0 CT1 27XBRD ANTBC (SUTURE) IMPLANT
SUT VIC AB 0 CT1 27XCR 8 STRN (SUTURE) ×6 IMPLANT
SUT VIC AB 2-0 SH 27 (SUTURE) ×8
SUT VIC AB 2-0 SH 27XBRD (SUTURE) ×3 IMPLANT
SUT VICRYL 2-0 SH 8X27 (SUTURE) ×1 IMPLANT
SYR 10ML LL (SYRINGE) ×4 IMPLANT
SYR BULB EAR ULCER 3OZ GRN STR (SYRINGE) ×4 IMPLANT
SYS SLING ADV FIT BLUE TRNSVAG (Sling) ×1 IMPLANT
TOWEL OR 17X26 10 PK STRL BLUE (TOWEL DISPOSABLE) ×4 IMPLANT
TRAY FOLEY W/BAG SLVR 14FR (SET/KITS/TRAYS/PACK) ×4 IMPLANT
TRAY FOLEY W/BAG SLVR 14FR LF (SET/KITS/TRAYS/PACK) ×4 IMPLANT
UNDERPAD 30X36 HEAVY ABSORB (UNDERPADS AND DIAPERS) ×4 IMPLANT

## 2021-11-20 NOTE — Anesthesia Procedure Notes (Signed)
Procedure Name: Intubation Date/Time: 11/20/2021 11:58 AM  Performed by: Montel Clock, CRNAPre-anesthesia Checklist: Patient identified, Emergency Drugs available, Suction available, Patient being monitored and Timeout performed Patient Re-evaluated:Patient Re-evaluated prior to induction Oxygen Delivery Method: Circle system utilized Preoxygenation: Pre-oxygenation with 100% oxygen Induction Type: IV induction Ventilation: Mask ventilation without difficulty and Oral airway inserted - appropriate to patient size Laryngoscope Size: Mac and 4 Grade View: Grade II Tube type: Oral Tube size: 7.0 mm Number of attempts: 1 Airway Equipment and Method: Stylet Placement Confirmation: ETT inserted through vocal cords under direct vision, positive ETCO2 and breath sounds checked- equal and bilateral Secured at: 21 cm Tube secured with: Tape Dental Injury: Teeth and Oropharynx as per pre-operative assessment  Comments: Grade 2b view with cricoid. ETT easily passed.

## 2021-11-20 NOTE — Interval H&P Note (Signed)
History and Physical Interval Note:  11/20/2021 11:10 AM  Rachael Dickerson  has presented today for surgery, with the diagnosis of personal history of malignant neoplasm of cervix uteri; stress urinary incontinence.  The various methods of treatment have been discussed with the patient and family. After consideration of risks, benefits and other options for treatment, the patient has consented to  Procedure(s): HYSTERECTOMY VAGINAL wtih bilateral salpingectomy (N/A) TRANSVAGINAL TAPE (TVT) PROCEDURE (N/A) CYSTOSCOPY (N/A) as a surgical intervention.  The patient's history has been reviewed, patient examined, no change in status, stable for surgery.  I have reviewed the patient's chart and labs.  Questions were answered to the patient's satisfaction.     Marguerita Beards

## 2021-11-20 NOTE — Op Note (Signed)
Operative Note  Preoperative Diagnosis:  History of adenocarcinoma in situ of the cervix, stress urinary incontinence  Postoperative Diagnosis: same  Procedures performed:  Total vaginal hysterectomy, bilateral salpingectomy, McCall's Culdoplasty, midurethral sling (advantage fit), cystoscopy  Implants:  Implant Name Type Inv. Item Serial No. Manufacturer Lot No. LRB No. Used Action  SYS SLING ADV FIT BLUE TRNSVAG - [id number] Sling SYS SLING ADV FIT BLUE TRNSVAG  [employer] [id number] N/A 1 Implanted    Attending Surgeon: Lanetta Inch, MD  Anesthesia: General endotracheal  Findings: 1. Normal appearing uterus, cervix, fallopian tubes and ovaries  2. On cystoscopy, normal bladder and urethra without injury, lesion or foreign body. Brisk bilateral ureteral efflux noted.    Specimens:  ID Type Source Tests Collected by Time Destination  1 : uterus, cervix, and bilateral fallopian tubes Tissue PATH Gyn benign resection SURGICAL PATHOLOGY Marguerita Beards, MD 11/20/2021 1248     Estimated blood loss: 50 mL  IV fluids: 800 mL  Urine output: 800 mL  Complications: none  Procedure in Detail:  After informed consent was obtained, the patient was taken to the operating room where she was placed under anesthesia.  She was then placed in the dorsal lithotomy position with Allen stirrups and prepped and draped in the usual sterile fashion.  Care was taken to avoid hyperflexion or hyperextension of her lower extremities.    A self-retaining retractor was placed, and a foley catheter was placed. The cervix was grasped with two single-tooth tenacula.  The cervix was injected circumferentially with 1% lidocaine with epinephrine. A 10 blade was used to incise circumferentially around the cervix.  Anteriorly, the bladder was dissected off the pubocervical fascia using Metzenbaum scissors. A Deaver retractor was placed anteriorly to protect the bladder. The posterior vagina was grasped  with a Kocher clamp. The Mayo scissors were used to enter the posterior cul-de-sac. Palpation confirmed peritoneal entry and no adhesions. The posterior peritoneum was affixed to the vaginal cuff with 0-Vicryl (this suture was used throughout unless otherwise specified) at the midline. A right angle retractor was placed through the posterior colpotomy. A Heaney clamp was then used to clamp the uterosacral ligaments on each side. These were cut and suture ligated using 0-Vicryl in a Heaney fashion, and tagged with hemostats. The anterior peritoneal reflection was then identified, tented up and incised with Metzenbaum scissors to create an anterior colpotomy. Palpation confirmed peritoneal entry and no adhesions. The Deaver was placed anteriorly to protect the bladder. The cardinal ligaments and uterine arteries with ligated and cut with a ligasure. The cornua were clamped, cut, free-tied and suture ligated. The uterus and cervix were handed off the field.  Inspection of the pedicles revealed excellent hemostasis.  The right fallopian tube was grapsed with a Babcock clamp. The mesosalpinx was cauterized and cut with the ligasure to remove the tube. The same was repeated to remove the left fallopian tube. The bowel was pushed away with a sponge stick  The right and left uterosacral ligaments were identified visually and digitally.  0 PDS was placed through the posterior vaginal cuff, through the left uterosacral ligament plicated in the posterior cul de sac, placed through the right uterosacral and back through the posterior cuff. The cuff was then closed with a 0-vicryl suture in a running fashion. The PDS sutures were tied down to support the vaginal apex. The foley catheter was removed. A 70-degree cystoscope was introduced, and 360-degree inspection revealed no trauma in the bladder, with bilateral ureteral efflux.  The bladder was drained and the cystoscope was removed.  The Foley catheter was reinserted.     The mid urethral area was located on the anterior vaginal wall.  Two Allis clamps were placed at the level of the midurethra. 1% lidocaine with epinephrine was injected into the vaginal mucosa. A vertical incision was made between the two clamps using a 15-blade scalpel.  Using sharp dissection, Metzenbaum scissors were used to make a periurethral tunnel from the vaginal incision towards the pubic rami bilaterally for the future sling tracts. The bladder was ensured to be empty. The trocar and attached sling were introduced into the right side of the periurethral vaginal incision, just inferior to the pubic symphysis on the right side. The trocar was guided through the endopelvic fascia and directly vertically.  While hugging the cephalad surface of the pubic bone, the trocar was guided out through the abdomen 2 fingerbreadths lateral to midline at the level of the pubic symphysis on the ipsilateral side. The trocar was placed on the left side in a similar fashion.  A 70-degree cystoscope was introduced, and 360-degree inspection revealed no trauma or trocars in the bladder, with brisk bilateral ureteral efflux.  The bladder was drained and the cystoscope was removed.  The Foley catheter was reinserted.  The sling was brought to lie beneath the mid-urethra.  A needle driver was placed behind the sling to ensure no tension.   The plastic sheath was removed from the sling and the distal ends of the sling were trimmed just below the level of the skin incisions.  Tension-free positioning of the sling was confirmed. Vaginal inspection revealed no vaginotomy or sling perforations of the mucosa. Surgiflo was placed into the incision and good hemostasis was noted. The vaginal mucosal edges were reapproximated using 2-0 Vicryl.  The vagina was copiously irrigated.  Hemostasis was again noted. Vaginal packing not placed. The suprapubic sling incisions were closed with Dermabond. The patient tolerated the procedure well.   She was awakened from anesthesia and transferred to the recovery room in stable condition. All needle and sponge counts were correct x 2.    Marguerita Beards, MD

## 2021-11-20 NOTE — Transfer of Care (Signed)
Immediate Anesthesia Transfer of Care Note  Patient: Rachael Dickerson  Procedure(s) Performed: Procedure(s) (LRB): HYSTERECTOMY VAGINAL wtih bilateral salpingectomy (N/A) TRANSVAGINAL TAPE (TVT) PROCEDURE (N/A) CYSTOSCOPY (N/A) VAGINAL VAULT SUSPENSION  Patient Location: PACU  Anesthesia Type: General  Level of Consciousness: drowsy, opens eyes and follows commands.  Airway & Oxygen Therapy: Patient Spontanous Breathing and Patient connected to face mask oxygen, oral airway removed.  Post-op Assessment: Report given to PACU RN and Post -op Vital signs reviewed and stable  Post vital signs: Reviewed and stable  Complications: No apparent anesthesia complications  Last Vitals:  Vitals Value Taken Time  BP 139/84 11/20/21 1350  Temp 37 C 11/20/21 1350  Pulse 108 11/20/21 1356  Resp 20 11/20/21 1356  SpO2 92 % 11/20/21 1356  Vitals shown include unvalidated device data.  Last Pain:  Vitals:   11/20/21 1010  TempSrc: Oral  PainSc: 0-No pain      Patients Stated Pain Goal: 5 (11/20/21 1010)  Complications: No notable events documented.

## 2021-11-21 ENCOUNTER — Encounter (HOSPITAL_BASED_OUTPATIENT_CLINIC_OR_DEPARTMENT_OTHER): Payer: Self-pay | Admitting: Obstetrics and Gynecology

## 2021-11-21 ENCOUNTER — Telehealth: Payer: Self-pay | Admitting: Obstetrics and Gynecology

## 2021-11-21 LAB — SURGICAL PATHOLOGY

## 2021-11-21 NOTE — Anesthesia Postprocedure Evaluation (Signed)
Anesthesia Post Note  Patient: Rachael Dickerson  Procedure(s) Performed: HYSTERECTOMY VAGINAL wtih bilateral salpingectomy (Vagina ) TRANSVAGINAL TAPE (TVT) PROCEDURE (Vagina ) CYSTOSCOPY (Bladder) VAGINAL VAULT SUSPENSION (Vagina )     Patient location during evaluation: PACU Anesthesia Type: General Level of consciousness: awake and alert Pain management: pain level controlled Vital Signs Assessment: post-procedure vital signs reviewed and stable Respiratory status: spontaneous breathing, nonlabored ventilation, respiratory function stable and patient connected to nasal cannula oxygen Cardiovascular status: blood pressure returned to baseline and stable Postop Assessment: no apparent nausea or vomiting Anesthetic complications: no   No notable events documented.  Last Vitals:  Vitals:   11/20/21 1600 11/20/21 1843  BP: 131/84 129/76  Pulse: 86 100  Resp: 16 18  Temp: 36.6 C 36.9 C  SpO2: 98% 98%    Last Pain:  Vitals:   11/21/21 0944  TempSrc:   PainSc: 2                  Trevor Iha

## 2021-11-21 NOTE — Telephone Encounter (Signed)
Rachael Dickerson underwent Total vaginal hysterectomy, bilateral salpingectomy, McCall's Culdoplasty, midurethral sling (advantage fit), cystoscopy on 11/20/21.   She passed her voiding trial.  was backfilled into the bladder Voided  PVR by bladder scan was 29ml.   She was discharged without a catheter. Please call her for a routine post op check. Thanks!  Marguerita Beards, MD

## 2021-11-21 NOTE — Telephone Encounter (Signed)
Post- Op Call  Rachael Dickerson underwent total vaginal hysterectomy, bilateral salpingectomy, McCall's culdoplasty, midurethral sling (advantage fit) and cystoscopy on 11/20/21 with Dr Florian Buff. The patient reports that her pain is controlled. She is taking tylenol and ibuprofen. She reports vaginal bleeding that is small.  She has not had a bowel movement and is taking miralax for a bowel regimen. She was discharged without a catheter and is voiding without difficulty. Advised to call with any problems or concerns.   Harrie Jeans, RN

## 2021-11-22 ENCOUNTER — Encounter: Payer: Self-pay | Admitting: *Deleted

## 2021-11-27 ENCOUNTER — Ambulatory Visit
Admission: RE | Admit: 2021-11-27 | Discharge: 2021-11-27 | Disposition: A | Discharge status: Home / Self Care | Payer: BC Managed Care – PPO | Source: Ambulatory Visit | Attending: Obstetrics & Gynecology | Admitting: Obstetrics & Gynecology

## 2021-11-27 ENCOUNTER — Encounter: Payer: Self-pay | Admitting: Obstetrics & Gynecology

## 2021-11-27 DIAGNOSIS — N6320 Unspecified lump in the left breast, unspecified quadrant: Secondary | ICD-10-CM

## 2021-12-03 NOTE — Progress Notes (Signed)
Rachael Dickerson is a 41 y.o. female called and notified of that FMLA paper were received by the UROGYN dept from [facility] Disability form.  Pt notified there will be a 10-14 day turn around for forms to be ready.  Pt notified  she will be contacted as soon as the from are ready and have been faxed.  Received via fax: 12/03/21 Pre-op date: 11/01/1921 Surgery date: 11/20/2021 Post op date:01/04/2022

## 2021-12-05 ENCOUNTER — Encounter: Payer: Self-pay | Admitting: Obstetrics and Gynecology

## 2022-01-04 ENCOUNTER — Ambulatory Visit (INDEPENDENT_AMBULATORY_CARE_PROVIDER_SITE_OTHER): Payer: BC Managed Care – PPO | Admitting: Obstetrics and Gynecology

## 2022-01-04 ENCOUNTER — Encounter: Payer: Self-pay | Admitting: Obstetrics and Gynecology

## 2022-01-04 VITALS — BP 142/86 | HR 92

## 2022-01-04 DIAGNOSIS — Z9889 Other specified postprocedural states: Secondary | ICD-10-CM

## 2022-01-04 DIAGNOSIS — N393 Stress incontinence (female) (male): Secondary | ICD-10-CM

## 2022-01-04 NOTE — Progress Notes (Signed)
[facility]  Date of Visit: 01/04/2022  History of Present Illness: Ms. Ramone is a 41 y.o. female scheduled today for a post-operative visit.   Surgery: s/p Total vaginal hysterectomy, bilateral salpingectomy, McCall's Culdoplasty, midurethral sling (advantage fit), cystoscopy on 11/20/21  She passed her postoperative void trial.   Postoperative course has been uncomplicated.   Today she reports she has been doing well overall. Last week she waited too long to go to the bathroom and she felt pain with urination. But this did not persist.   UTI in the last 6 weeks? No  Pain? No  She has returned to her normal activity (except for postop restrictions) Vaginal bulge? No  Stress incontinence: Yes - had some leakage with a sneeze about two weeks ago. Has had a good improvement overall.  Urgency/frequency: No  Urge incontinence: No  Voiding dysfunction: No  Bowel issues: No   Pathology results: A. UTERUS, CERVIX, BILATERAL FALLOPIAN TUBES, HYSTERECTOMY AND  SALPINGECTOMY:  -  Cervix with focal mild koilocytic change consistent with low-grade  squamous intraepithelial lesion (LGSIL in the background of extensive  immature squamous metaplasia with reactive/reparative change and focal  tubal metaplasia, negative for adenocarcinoma in situ (AIS), cervix  entirely submitted for microscopic evaluation (history of AIS noted).  -  Endometrium with features consistent with hormone/IUD effect.  -  Unremarkable endometrium.  -  Unremarkable bilateral fallopian tubes with incidental paratubal  simple cysts.   Medications: She has a current medication list which includes the following prescription(s): enalapril, fluticasone, and levocetirizine.   Allergies: Patient is allergic to cranberry, erythromycin, and latex.   Physical Exam: BP (!) 142/86   Pulse 92   LMP 06/08/2018   Abdomen: soft, non-tender, without masses or organomegaly Speculum: Vagina: Incisions healing well.  Sutures are present at the cuff and there is not granulation tissue. No tenderness along the anterior or posterior vagina. No apical tenderness. No pelvic masses. No visible or palpable mesh.  POP-Q: POP-Q  -2                                            Aa   -2                                           Ba  -9                                              C   3                                            Gh  4                                            Pb  9  tvl   -3                                            Ap  -3                                            Bp                                                 D    ---------------------------------------------------------  Assessment and Plan:  1. Post-operative state   2. SUI (stress urinary incontinence, female)     - Healing well. Can resume regular activity including exercise. Wait an additional two weeks prior to intercourse - Pathology results were reviewed with the patient today. - Recommended yearly GYN follow up.  - For SUI, discussed that symptoms may still improve over the next few months but do not always expect 100% improvement of symptoms. Discussed option of pelvic PT for pelvic floor strengthening and she does not want a referral at this time but will consider it.   Return as needed  Jaquita Folds, MD

## 2022-01-23 NOTE — Progress Notes (Signed)
Rachael Dickerson is a 41 y.o. female called and notified of that FMLA paper were received by the [place] from [place] notified there will be a 10-14 day turn around for forms to be ready.  Pt notified  she will be contacted as soon as the from are ready and have been faxed. --- Forms were faxed back on 01/22/22 at 4:50pm

## 2022-03-06 ENCOUNTER — Ambulatory Visit: Payer: BC Managed Care – PPO | Admitting: Obstetrics and Gynecology

## 2022-11-08 ENCOUNTER — Telehealth: Payer: Self-pay | Admitting: *Deleted

## 2022-11-08 NOTE — Telephone Encounter (Signed)
Left patient a message to call and schedule annual for the year. 

## 2022-12-03 ENCOUNTER — Telehealth: Payer: Self-pay | Admitting: *Deleted

## 2022-12-03 NOTE — Telephone Encounter (Signed)
Returned call from 12/02/2022 at 10:59 AM, not in the office. Left patient a message to call and schedule annual.

## 2022-12-30 ENCOUNTER — Ambulatory Visit: Payer: BC Managed Care – PPO | Admitting: Obstetrics & Gynecology

## 2023-01-15 ENCOUNTER — Other Ambulatory Visit: Payer: Self-pay | Admitting: Obstetrics & Gynecology

## 2023-01-15 DIAGNOSIS — Z1231 Encounter for screening mammogram for malignant neoplasm of breast: Secondary | ICD-10-CM

## 2023-02-07 DIAGNOSIS — Z1231 Encounter for screening mammogram for malignant neoplasm of breast: Secondary | ICD-10-CM

## 2023-02-10 ENCOUNTER — Other Ambulatory Visit (HOSPITAL_COMMUNITY)
Admission: RE | Admit: 2023-02-10 | Discharge: 2023-02-10 | Disposition: A | Discharge status: Home / Self Care | Payer: 59 | Source: Ambulatory Visit | Attending: Obstetrics & Gynecology | Admitting: Obstetrics & Gynecology

## 2023-02-10 ENCOUNTER — Encounter: Payer: Self-pay | Admitting: Obstetrics & Gynecology

## 2023-02-10 ENCOUNTER — Ambulatory Visit (INDEPENDENT_AMBULATORY_CARE_PROVIDER_SITE_OTHER): Payer: 59 | Admitting: Obstetrics & Gynecology

## 2023-02-10 ENCOUNTER — Ambulatory Visit
Admission: RE | Admit: 2023-02-10 | Discharge: 2023-02-10 | Disposition: A | Discharge status: Home / Self Care | Payer: 59 | Source: Ambulatory Visit

## 2023-02-10 VITALS — BP 125/79 | HR 98 | Resp 16 | Ht 65.0 in | Wt 156.0 lb

## 2023-02-10 DIAGNOSIS — Z01419 Encounter for gynecological examination (general) (routine) without abnormal findings: Secondary | ICD-10-CM | POA: Insufficient documentation

## 2023-02-10 DIAGNOSIS — Z8541 Personal history of malignant neoplasm of cervix uteri: Secondary | ICD-10-CM | POA: Diagnosis present

## 2023-02-10 DIAGNOSIS — Z1231 Encounter for screening mammogram for malignant neoplasm of breast: Secondary | ICD-10-CM

## 2023-02-10 NOTE — Progress Notes (Signed)
Last Mammogram: Today Last Pap Smear:  1/23 Last Colon Screening;  never Seat Belts:   yes Sun Screen:   yes Dental Check Up:  yes Brush & Floss:  yes   Subjective:     Rachael Dickerson is a 42 y.o. female here for a routine exam.  Current complaints: none--has lost 50 pounds!!   Gynecologic History Patient's last menstrual period was 06/08/2018. Contraception: status post hysterectomy Last Mammogram: Today (had a biopsy in 2023 and marker placed) Last Pap Smear:  1/23 Last Colon Screening;  never Seat Belts:   yes Sun Screen:   yes Dental Check Up:  yes Brush & Floss:  yes     Obstetric History OB History  Gravida Para Term Preterm AB Living  1 1 1  0 0 1  SAB IAB Ectopic Multiple Live Births  0 0 0 0 1    # Outcome Date GA Lbr Len/2nd Weight Sex Type Anes PTL Lv  1 Term 07/02/19 [redacted]w[redacted]d 43:48 / 02:32 6 lb 4.4 oz (2.846 kg) M Vag-Vacuum EPI  LIV     The following portions of the patient's history were reviewed and updated as appropriate: allergies, current medications, past family history, past medical history, past social history, past surgical history, and problem list.  Review of Systems Pertinent items noted in HPI and remainder of comprehensive ROS otherwise negative.    Objective:     Vitals:   02/10/23 1006  BP: 125/79  Pulse: 98  Resp: 16  Weight: 156 lb (70.8 kg)  Height: 5\' 5"  (1.651 m)   Vitals:  WNL General appearance: alert, cooperative and no distress  HEENT: Normocephalic, without obvious abnormality, atraumatic Eyes: negative Throat: lips, mucosa, and tongue normal; teeth and gums normal  Respiratory: Clear to auscultation bilaterally  CV: Regular rate and rhythm  Breasts:  Normal appearance, no masses or tenderness, no nipple retraction or dimpling; prominent xiphoid,  GI: Soft, non-tender; bowel sounds normal; no masses,  no organomegaly  GU: External Genitalia:  Tanner V, no lesion Urethra:  No prolapse; no mesh felt  Vagina: Pink,  normal rugae, no blood or discharge  Cervix: Surgically absent  Uterus:  Surgically absent  Adnexa: Normal, no masses, non tender  Musculoskeletal: No edema, redness or tenderness in the calves or thighs  Skin: No lesions or rash  Lymphatic: Axillary adenopathy: none     Psychiatric: Normal mood and behavior        Assessment:    Healthy female exam.    Plan:    Pap smear--Pathology from hysterectomy 2023 A. UTERUS, CERVIX, BILATERAL FALLOPIAN TUBES, HYSTERECTOMY AND  SALPINGECTOMY:  - Cervix with focal mild koilocytic change consistent with low-grade  squamous intraepithelial lesion (LGSIL in the background of extensive  immature squamous metaplasia with reactive/reparative change and focal  tubal metaplasia, negative for adenocarcinoma in situ (AIS), cervix  entirely submitted for microscopic evaluation (history of AIS noted).  -  Endometrium with features consistent with hormone/IUD effect.  -  Unremarkable endometrium.  -  Unremarkable bilateral fallopian tubes with incidental paratubal  simple cysts.  Yearly mammograms Colon cancer screening age 70 Congrats on weight loss and changing eating habits RTC 1 year

## 2023-02-12 LAB — CYTOLOGY - PAP
Adequacy: ABSENT
Comment: NEGATIVE
Diagnosis: NEGATIVE
Diagnosis: REACTIVE
High risk HPV: NEGATIVE

## 2024-02-09 ENCOUNTER — Other Ambulatory Visit: Payer: Self-pay | Admitting: Obstetrics & Gynecology

## 2024-02-09 DIAGNOSIS — Z1231 Encounter for screening mammogram for malignant neoplasm of breast: Secondary | ICD-10-CM

## 2024-03-08 ENCOUNTER — Encounter: Payer: Self-pay | Admitting: Obstetrics & Gynecology

## 2024-03-08 ENCOUNTER — Ambulatory Visit (INDEPENDENT_AMBULATORY_CARE_PROVIDER_SITE_OTHER): Admitting: Obstetrics & Gynecology

## 2024-03-08 ENCOUNTER — Ambulatory Visit
Admission: RE | Admit: 2024-03-08 | Discharge: 2024-03-08 | Disposition: A | Discharge status: Home / Self Care | Source: Ambulatory Visit

## 2024-03-08 ENCOUNTER — Other Ambulatory Visit (HOSPITAL_COMMUNITY)
Admission: RE | Admit: 2024-03-08 | Discharge: 2024-03-08 | Disposition: A | Source: Ambulatory Visit | Attending: Obstetrics & Gynecology | Admitting: Obstetrics & Gynecology

## 2024-03-08 VITALS — Ht 63.0 in | Wt 141.1 lb

## 2024-03-08 DIAGNOSIS — Z01419 Encounter for gynecological examination (general) (routine) without abnormal findings: Secondary | ICD-10-CM | POA: Insufficient documentation

## 2024-03-08 DIAGNOSIS — Z8541 Personal history of malignant neoplasm of cervix uteri: Secondary | ICD-10-CM

## 2024-03-08 DIAGNOSIS — Z1231 Encounter for screening mammogram for malignant neoplasm of breast: Secondary | ICD-10-CM

## 2024-03-08 NOTE — Progress Notes (Unsigned)
  Subjective:     Rachael Dickerson is a 43 y.o. female here for a routine exam.  Current complaints: none.  Needs pap smear due to hx of AIS.  Pt is also interested if she is entering perimenopause--has hyst so no cycles.     Gynecologic History Patient's last menstrual period was 06/08/2018. Contraception: IUD Last pap smear (date and result):02/10/23 Last mammogram (date and result):02/10/23 Last colon screening (date and result):NA Brush:Yes Floss:Yes Seatbelts: Yes Sunscreen: Yes  Obstetric History OB History  Gravida Para Term Preterm AB Living  1 1 1  0 0 1  SAB IAB Ectopic Multiple Live Births  0 0 0 0 1    # Outcome Date GA Lbr Len/2nd Weight Sex Type Anes PTL Lv  1 Term 07/02/19 [redacted]w[redacted]d 43:48 / 02:32 6 lb 4.4 oz (2.846 kg) M Vag-Vacuum EPI  LIV     The following portions of the patient's history were reviewed and updated as appropriate: allergies, current medications, past family history, past medical history, past social history, past surgical history, and problem list.  Review of Systems Pertinent items noted in HPI and remainder of comprehensive ROS otherwise negative.    Objective:     Vitals:   03/08/24 1040  Weight: 141 lb 1.3 oz (64 kg)  Height: 5' 3 (1.6 m)   Vitals:  WNL General appearance: alert, cooperative and no distress  HEENT: Normocephalic, without obvious abnormality, atraumatic Eyes: negative Throat: lips, mucosa, and tongue normal; teeth and gums normal  Respiratory: Clear to auscultation bilaterally  CV: Regular rate and rhythm  Breasts:  Normal appearance, no masses or tenderness, no nipple retraction or dimpling  GI: Soft, non-tender; bowel sounds normal; no masses,  no organomegaly  GU: External Genitalia:  Tanner V, no lesion Urethra:  No prolapse   Vagina: Pink, normal rugae, no blood or discharge  Cervix: No CMT, no lesion  Uterus:  Normal size and contour, non tender  Adnexa: Normal, no masses, non tender  Musculoskeletal: No  edema, redness or tenderness in the calves or thighs  Skin: No lesions or rash  Lymphatic: Axillary adenopathy: none     Psychiatric: Normal mood and behavior        Assessment:    Healthy female exam.    Plan:    Pap for hx of AIS (yearly for 25 years per ASCCP guidelines) Yearly mammogram  Perimenopausal symptoms--check FSH and estradiol   (no cycle dur to hyst)

## 2024-03-09 LAB — FSH/LH
FSH: 3.3 m[IU]/mL
LH: 3.5 m[IU]/mL

## 2024-03-09 LAB — ESTRADIOL: Estradiol: 80.6 pg/mL

## 2024-03-11 ENCOUNTER — Ambulatory Visit: Payer: Self-pay | Admitting: Obstetrics & Gynecology

## 2024-03-12 LAB — CYTOLOGY - PAP
Comment: NEGATIVE
Diagnosis: NEGATIVE
High risk HPV: NEGATIVE

## 2024-03-19 ENCOUNTER — Ambulatory Visit: Payer: Self-pay | Admitting: Obstetrics & Gynecology

## 2024-04-13 ENCOUNTER — Telehealth: Payer: Self-pay | Admitting: *Deleted

## 2024-04-13 NOTE — Telephone Encounter (Signed)
 Left patient a message to call and schedule virtual F/U with Dr. Cris.

## 2024-05-10 ENCOUNTER — Encounter: Payer: Self-pay | Admitting: Obstetrics & Gynecology

## 2024-05-10 ENCOUNTER — Telehealth: Payer: Self-pay | Admitting: Obstetrics & Gynecology

## 2024-05-17 ENCOUNTER — Telehealth: Admitting: Obstetrics & Gynecology

## 2024-06-21 ENCOUNTER — Telehealth: Admitting: Obstetrics & Gynecology
